# Patient Record
Sex: Male | Born: 1980 | Race: Black or African American | Hispanic: No | Marital: Single | Smoking: Current every day smoker
Health system: Southern US, Community
[De-identification: ages and names within clinical notes are randomized; demographics above are authoritative.]

## PROBLEM LIST (undated history)

## (undated) DIAGNOSIS — K219 Gastro-esophageal reflux disease without esophagitis: Secondary | ICD-10-CM

## (undated) DIAGNOSIS — I10 Essential (primary) hypertension: Secondary | ICD-10-CM

## (undated) DIAGNOSIS — K5792 Diverticulitis of intestine, part unspecified, without perforation or abscess without bleeding: Secondary | ICD-10-CM

---

## 2012-10-03 ENCOUNTER — Encounter (HOSPITAL_COMMUNITY): Payer: Self-pay | Admitting: Cardiology

## 2012-10-03 ENCOUNTER — Emergency Department (HOSPITAL_COMMUNITY)
Admission: EM | Admit: 2012-10-03 | Discharge: 2012-10-03 | Discharge status: Against Medical Advice | Payer: Self-pay | Attending: Emergency Medicine | Admitting: Emergency Medicine

## 2012-10-03 DIAGNOSIS — IMO0002 Reserved for concepts with insufficient information to code with codable children: Secondary | ICD-10-CM | POA: Insufficient documentation

## 2012-10-03 DIAGNOSIS — Y9389 Activity, other specified: Secondary | ICD-10-CM | POA: Insufficient documentation

## 2012-10-03 DIAGNOSIS — Y92410 Unspecified street and highway as the place of occurrence of the external cause: Secondary | ICD-10-CM | POA: Insufficient documentation
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
## 2012-10-03 NOTE — ED Notes (Signed)
Called pt 2 times and no answer

## 2016-03-15 ENCOUNTER — Other Ambulatory Visit: Payer: Self-pay

## 2016-03-15 ENCOUNTER — Emergency Department (HOSPITAL_BASED_OUTPATIENT_CLINIC_OR_DEPARTMENT_OTHER)
Admission: EM | Admit: 2016-03-15 | Discharge: 2016-03-16 | Disposition: A | Discharge status: Home / Self Care | Payer: Self-pay | Attending: Emergency Medicine | Admitting: Emergency Medicine

## 2016-03-15 DIAGNOSIS — R0789 Other chest pain: Secondary | ICD-10-CM | POA: Insufficient documentation

## 2016-03-15 NOTE — ED Notes (Signed)
Patient states that he is having pain to his left chest. Reports that it started about 2 hours ago. Vomited x 1

## 2016-03-16 ENCOUNTER — Encounter (HOSPITAL_BASED_OUTPATIENT_CLINIC_OR_DEPARTMENT_OTHER): Payer: Self-pay | Admitting: Emergency Medicine

## 2016-03-16 ENCOUNTER — Emergency Department (HOSPITAL_BASED_OUTPATIENT_CLINIC_OR_DEPARTMENT_OTHER): Payer: Self-pay

## 2016-03-16 MED ORDER — IBUPROFEN 800 MG PO TABS: 800.0000 mg | ORAL_TABLET | Freq: Three times a day (TID) | ORAL | Status: DC | PRN

## 2016-03-16 MED ORDER — KETOROLAC TROMETHAMINE 30 MG/ML IJ SOLN
60.0000 mg | Freq: Once | INTRAMUSCULAR | Status: AC
  Administered 2016-03-16: 60 mg via INTRAMUSCULAR
  Filled 2016-03-16: qty 2

## 2016-03-16 NOTE — ED Notes (Signed)
Pt given juice

## 2016-03-16 NOTE — Discharge Instructions (Signed)
Chest Wall Pain °Chest wall pain is pain in or around the bones and muscles of your chest. Sometimes, an injury causes this pain. Sometimes, the cause may not be known. This pain may take several weeks or longer to get better. °HOME CARE INSTRUCTIONS  °Pay attention to any changes in your symptoms. Take these actions to help with your pain:  °· Rest as told by your health care provider.   °· Avoid activities that cause pain. These include any activities that use your chest muscles or your abdominal and side muscles to lift heavy items.    °· If directed, apply ice to the painful area: °¨ Put ice in a plastic bag. °¨ Place a towel between your skin and the bag. °¨ Leave the ice on for 20 minutes, 2-3 times per day. °· Take over-the-counter and prescription medicines only as told by your health care provider. °· Do not use tobacco products, including cigarettes, chewing tobacco, and e-cigarettes. If you need help quitting, ask your health care provider. °· Keep all follow-up visits as told by your health care provider. This is important. °SEEK MEDICAL CARE IF: °· You have a fever. °· Your chest pain becomes worse. °· You have new symptoms. °SEEK IMMEDIATE MEDICAL CARE IF: °· You have nausea or vomiting. °· You feel sweaty or light-headed. °· You have a cough with phlegm (sputum) or you cough up blood. °· You develop shortness of breath. °  °This information is not intended to replace advice given to you by your health care provider. Make sure you discuss any questions you have with your health care provider. °  °Document Released: 09/14/2005 Document Revised: 06/05/2015 Document Reviewed: 12/10/2014 °Elsevier Interactive Patient Education ©2016 [employer]vier Inc. ° ° °To find a primary care or specialty doctor please call [phone] or [phone] to access "[facility] Find a Doctor Service." ° °You may also go on the [facility] website at [email or web address] ° °There are also multiple [facility]  and [facility] practices throughout the [place] that are frequently accepting new patients. You may find a clinic that is close to your home and contact them. ° °[facility] -  °[address] °[place] [address] °[phone] ° °[facility] in [place] (also locations in [place] and [place]) -  °[address] °[place] [address] °[phone] ° °[facility] -  °[address] °[place] [address] °[phone] ° ° ° °

## 2016-03-16 NOTE — ED Notes (Signed)
Patient transported to X-ray 

## 2016-03-16 NOTE — ED Provider Notes (Signed)
By signing my name below, I, Marisue Humble, attest that this documentation has been prepared under the direction and in the presence of Kaeley Vinje N Carolin Quang, DO . Electronically Signed: Marisue Humble, Scribe. 03/16/2016. 12:36 AM.  TIME SEEN: 12:22 AM  CHIEF COMPLAINT: Chest Pain  HPI: HPI Comments:  Spencer Garza is a 35 y.o. male with no pertinent PMHx who presents to the Emergency Department complaining of sharp left side chest pain onset ~2 hours ago while resting, worse with twisting and moving left arm. Pt reports associated episode of vomiting and tingling in left arm and shoulder. Nausea is currently alleviated. No other alleviating or exacerbating factors noted. He notes similar symptoms ~6 months ago; he was evaluated at [facility], and reports having a CT scan with no acute diagnosis. Pt is a current smoker. He notes his uncle had an MI at age 13, grandmother at 67. No history of PE, DVT, exogenous estrogen use, fracture, surgery, trauma, hospitalization, prolonged travel. No lower extremity swelling or pain. No calf tenderness.  He denies that pain is exertional, pleuritic. No pressure, diaphoresis, dizziness, shortness of breath. Denies fever or cough.  ROS: See HPI Constitutional: no fever  Eyes: no drainage  ENT: no runny nose   Cardiovascular:  chest pain  Resp: no SOB  GI: vomiting GU: no dysuria Integumentary: no rash  Allergy: no hives  Musculoskeletal: no leg swelling  Neurological: no slurred speech ROS otherwise negative  PAST MEDICAL HISTORY/PAST SURGICAL HISTORY:  History reviewed. No pertinent past medical history.  MEDICATIONS:  Prior to Admission medications   Not on File    ALLERGIES:  No Known Allergies  SOCIAL HISTORY:  Social History  Substance Use Topics  . Smoking status: Never Smoker   . Smokeless tobacco: Not on file  . Alcohol Use: Yes    FAMILY HISTORY: History reviewed. No pertinent family history.  EXAM: BP 115/86 mmHg   Pulse 91  Temp(Src) 98.6 F (37 C) (Oral)  Resp 22  Ht  (1.676 m)  Wt 255 lb (115.667 kg)  BMI 41.18 kg/m2  SpO2 94% CONSTITUTIONAL: Alert and oriented and responds appropriately to questions. Well-appearing; well-nourished HEAD: Normocephalic EYES: Conjunctivae clear, PERRL ENT: normal nose; no rhinorrhea; moist mucous membranes NECK: Supple, no meningismus, no LAD  CARD: RRR; S1 and S2 appreciated; no murmurs, no clicks, no rubs, no gallops CHEST: pt says non tender with palpation of chest wall but does grimace when I palpate the left chest wall; no crepitus, deformity; pt has pain reproducible with movement of left arm RESP: Normal chest excursion without splinting or tachypnea; breath sounds clear and equal bilaterally; no wheezes, no rhonchi, no rales, no hypoxia or respiratory distress, speaking full sentences ABD/GI: Normal bowel sounds; non-distended; soft, non-tender, no rebound, no guarding, no peritoneal signs BACK:  The back appears normal and is non-tender to palpation, there is no CVA tenderness EXT: Normal ROM in all joints; non-tender to palpation; no edema; normal capillary refill; no cyanosis, no calf tenderness or swelling; no bony tenderness over left shoulder; 2+ left radial pulse; soft compartments SKIN: Normal color for age and race; warm; no rash NEURO: Moves all extremities equally, sensation to light touch intact diffusely, cranial nerves II through XII intact PSYCH: The patient's mood and manner are appropriate. Grooming and personal hygiene are appropriate.  MEDICAL DECISION MAKING: Patient here with what seems to be musculoskeletal chest pain. Pain is sharp in nature and worse with movement of the left arm. EKG shows no  ischemic abnormality. We'll obtain chest x-ray, give Toradol. Doubt ACS, dissection or PE. No risk factors for pulmonary embolus.  I have reviewed patient's outside hospital records. It does appear that [facility] 06/[facility]/2015 for similar symptoms. Had a negative CTA of his chest and 2 negative troponins.  ED PROGRESS: 1:55 AM  Pt's pain is much better. Chest x-ray is clear.  Awaiting official radiology read but anticipate discharge home. We'll discharge with anti-inflammatories. Discussed return precautions. He verbalizes understanding and is comfortable with this plan.    At this time, I do not feel there is any life-threatening condition present. I have reviewed and discussed all results (EKG, imaging, lab, urine as appropriate), exam findings with patient. I have reviewed nursing notes and appropriate previous records.  I feel the patient is safe to be discharged home without further emergent workup. Discussed usual and customary return precautions. Patient and family (if present) verbalize understanding and are comfortable with this plan.  Patient will follow-up with their primary care provider. If they do not have a primary care provider, information for follow-up has been provided to them. All questions have been answered.    EKG Interpretation  Date/Time:  Sunday March 15 2016 23:58:57 EDT Ventricular Rate:  97 PR Interval:  136 QRS Duration: 76 QT Interval:  326 QTC Calculation: 414 R Axis:   47 Text Interpretation:  Normal sinus rhythm Possible Left atrial enlargement Cannot rule out Anterior infarct , age undetermined Abnormal ECG No old tracing to compare Confirmed by Keeyon Privitera,  DO, Tadd Holtmeyer ([id number]) on 03/16/2016 12:02:27 AM      I personally performed the services described in this documentation, which was scribed in my presence. The recorded information has been reviewed and is accurate.    Layla MawKristen N Mykaylah Ballman, DO 03/16/16 [phone]

## 2016-03-16 NOTE — ED Notes (Signed)
MD at bedside. 

## 2016-08-16 ENCOUNTER — Encounter (HOSPITAL_BASED_OUTPATIENT_CLINIC_OR_DEPARTMENT_OTHER): Payer: Self-pay | Admitting: Emergency Medicine

## 2016-08-16 ENCOUNTER — Emergency Department (HOSPITAL_BASED_OUTPATIENT_CLINIC_OR_DEPARTMENT_OTHER): Payer: Self-pay

## 2016-08-16 ENCOUNTER — Emergency Department (HOSPITAL_BASED_OUTPATIENT_CLINIC_OR_DEPARTMENT_OTHER)
Admission: EM | Admit: 2016-08-16 | Discharge: 2016-08-16 | Disposition: A | Discharge status: Home / Self Care | Payer: Self-pay | Attending: Emergency Medicine | Admitting: Emergency Medicine

## 2016-08-16 DIAGNOSIS — F17200 Nicotine dependence, unspecified, uncomplicated: Secondary | ICD-10-CM | POA: Insufficient documentation

## 2016-08-16 DIAGNOSIS — B349 Viral infection, unspecified: Secondary | ICD-10-CM | POA: Insufficient documentation

## 2016-08-16 MED ORDER — ACETAMINOPHEN 325 MG PO TABS
650.0000 mg | ORAL_TABLET | Freq: Once | ORAL | Status: AC
  Administered 2016-08-16: 650 mg via ORAL
  Filled 2016-08-16: qty 2

## 2016-08-16 MED ORDER — PROMETHAZINE-DM 6.25-15 MG/5ML PO SYRP: 5.0000 mL | ORAL_SOLUTION | Freq: Four times a day (QID) | ORAL | 0 refills | Status: DC | PRN

## 2016-08-16 MED ORDER — GUAIFENESIN 100 MG/5ML PO LIQD: 100.0000 mg | ORAL | 0 refills | Status: DC | PRN

## 2016-08-16 NOTE — ED Notes (Signed)
Patient denies pain and is resting comfortably.  

## 2016-08-16 NOTE — ED Triage Notes (Signed)
Patient reports feeling bad x 3 days.  Reports body aches, chest congestion, intermittent headache with sinus pressure.

## 2016-08-16 NOTE — ED Provider Notes (Signed)
[facility] Provider Note   CSN: [id number] Arrival date & time: 08/16/16  1706  By signing my name below, I, Rosario AdieWilliam Andrew Hiatt, attest that this documentation has been prepared under the direction and in the presence of Fayrene HelperBowie Azjah Pardo, PA-C.  Electronically Signed: Rosario AdieWilliam Andrew Hiatt, ED Scribe. 08/16/16. 5:34 PM.  History   Chief Complaint Chief Complaint  Patient presents with  . Generalized Body Aches   The history is provided by the patient. No language interpreter was used.    HPI Comments: Jerelyn CharlesColby Vespa is a 35 y.o. male with no pertinent PMHx, who presents to the Emergency Department complaining of persistent, diffuse myalgias/arthralgias onset approximately 3 days ago. He reports associated sore throat, chest congestion, rhinorrhea, sneezing, dry cough, subjective fever, and fatigue secondary to his current symptoms. He has been taking Tylenol, Alka Seltzer, and using Mucinex with minimal relief of his symptoms. No sick contacts with similar symptoms. Pt did no receive his Influenza or PNA vaccination this year. He denies SOB, vomiting, nausea, diarrhea, rash, or any other associated symptoms.   History reviewed. No pertinent past medical history.  There are no active problems to display for this patient.  History reviewed. No pertinent surgical history.  Home Medications    Prior to Admission medications   Medication Sig Start Date End Date Taking? Authorizing Provider  ibuprofen (ADVIL,MOTRIN) 800 MG tablet Take 1 tablet (800 mg total) by mouth every 8 (eight) hours as needed for mild pain. 03/16/16   Layla MawKristen N Ward, DO   Family History History reviewed. No pertinent family history.  Social History Social History  Substance Use Topics  . Smoking status: Current Every Day Smoker    Packs/day: 0.50  . Smokeless tobacco: Never Used  . Alcohol use Yes   Allergies   Patient has no known allergies.  Review of Systems Review of Systems  Constitutional:  Positive for fever (subjective).  HENT: Positive for congestion, rhinorrhea, sneezing and sore throat.   Respiratory: Positive for cough. Negative for shortness of breath.   Gastrointestinal: Negative for diarrhea, nausea and vomiting.  Musculoskeletal: Positive for arthralgias (diffuse) and myalgias (diffuse).  Skin: Negative for rash.   Physical Exam Updated Vital Signs BP 151/94 (BP Location: Right Arm)   Pulse 102   Temp 100.1 F (37.8 C) (Oral)   Resp 19   Ht 5\' 5"  (1.651 m)   Wt 257 lb (116.6 kg)   SpO2 98%   BMI 42.77 kg/m   Physical Exam  Constitutional: He appears well-developed and well-nourished. No distress.  HENT:  Head: Normocephalic and atraumatic.  Right Ear: External ear normal.  Left Ear: External ear normal.  Mouth/Throat: Uvula is midline, oropharynx is clear and moist and mucous membranes are normal. No trismus in the jaw. No uvula swelling. No oropharyngeal exudate, posterior oropharyngeal edema, posterior oropharyngeal erythema or tonsillar abscesses. No tonsillar exudate.  Right ear with cerumen impaction but left is otherwise normal. Mild congestion in the nose.   Eyes: Conjunctivae are normal.  Neck: Normal range of motion.  Cardiovascular: Regular rhythm and normal heart sounds.  Tachycardia present.   No murmur heard. Mild tachycardia.  Pulmonary/Chest: Effort normal and breath sounds normal. No respiratory distress. He has no wheezes. He has no rales.  Abdominal: He exhibits no distension.  Musculoskeletal: Normal range of motion.  Neurological: He is alert.  Skin: No pallor.  Psychiatric: He has a normal mood and affect. His behavior is normal.  Nursing note and vitals reviewed.  ED Treatments / Results  DIAGNOSTIC STUDIES: Oxygen Saturation is 98% on RA, normal by my interpretation.   COORDINATION OF CARE: 5:32 PM-Discussed next steps with pt. Pt verbalized understanding and is agreeable with the plan.   Labs (all labs ordered are  listed, but only abnormal results are displayed) Labs Reviewed - No data to display  EKG  EKG Interpretation None      Radiology No results found.  Procedures Procedures   Medications Ordered in ED Medications - No data to display  Initial Impression / Assessment and Plan / ED Course  I have reviewed the triage vital signs and the nursing notes.  Pertinent labs & imaging results that were available during my care of the patient were reviewed by me and considered in my medical decision making (see chart for details).  Clinical Course    Pt CXR negative for acute infiltrate. Patients symptoms are consistent with URI, likely viral etiology. Discussed that antibiotics are not indicated for viral infections. Pt will be discharged with symptomatic treatment.  Verbalizes understanding and is agreeable with plan. Pt is hemodynamically stable & in NAD prior to dc.  Final Clinical Impressions(s) / ED Diagnoses   Final diagnoses:  Viral syndrome   New Prescriptions New Prescriptions   GUAIFENESIN (ROBITUSSIN) 100 MG/5ML LIQUID    Take 5-10 mLs (100-200 mg total) by mouth every 4 (four) hours as needed for congestion.   PROMETHAZINE-DEXTROMETHORPHAN (PROMETHAZINE-DM) 6.25-15 MG/5ML SYRUP    Take 5 mLs by mouth 4 (four) times daily as needed for cough.   I personally performed the services described in this documentation, which was scribed in my presence. The recorded information has been reviewed and is accurate.       Fayrene HelperBowie Kemond Amorin, PA-C 08/16/16 1811    Alvira MondayErin Schlossman, MD 08/17/16 2121

## 2016-08-16 NOTE — ED Notes (Signed)
States has had nasal congestion and cough for the past few days

## 2017-03-12 ENCOUNTER — Emergency Department (HOSPITAL_BASED_OUTPATIENT_CLINIC_OR_DEPARTMENT_OTHER)
Admission: EM | Admit: 2017-03-12 | Discharge: 2017-03-12 | Disposition: A | Discharge status: Home / Self Care | Payer: Self-pay | Attending: Emergency Medicine | Admitting: Emergency Medicine

## 2017-03-12 ENCOUNTER — Emergency Department (HOSPITAL_BASED_OUTPATIENT_CLINIC_OR_DEPARTMENT_OTHER): Payer: Self-pay

## 2017-03-12 ENCOUNTER — Encounter (HOSPITAL_BASED_OUTPATIENT_CLINIC_OR_DEPARTMENT_OTHER): Payer: Self-pay | Admitting: *Deleted

## 2017-03-12 DIAGNOSIS — F17200 Nicotine dependence, unspecified, uncomplicated: Secondary | ICD-10-CM | POA: Insufficient documentation

## 2017-03-12 DIAGNOSIS — K219 Gastro-esophageal reflux disease without esophagitis: Secondary | ICD-10-CM | POA: Insufficient documentation

## 2017-03-12 LAB — CBC WITH DIFFERENTIAL/PLATELET
BASOS ABS: 0 10*3/uL (ref 0.0–0.1)
Basophils Relative: 1 %
EOS PCT: 1 %
Eosinophils Absolute: 0.1 10*3/uL (ref 0.0–0.7)
HCT: 47.1 % (ref 39.0–52.0)
Hemoglobin: 16.7 g/dL (ref 13.0–17.0)
LYMPHS ABS: 1.7 10*3/uL (ref 0.7–4.0)
LYMPHS PCT: 29 %
MCH: 31.6 pg (ref 26.0–34.0)
MCHC: 35.5 g/dL (ref 30.0–36.0)
MCV: 89.2 fL (ref 78.0–100.0)
MONO ABS: 0.9 10*3/uL (ref 0.1–1.0)
Monocytes Relative: 15 %
NEUTROS ABS: 3.2 10*3/uL (ref 1.7–7.7)
Neutrophils Relative %: 54 %
Platelets: 222 10*3/uL (ref 150–400)
RBC: 5.28 MIL/uL (ref 4.22–5.81)
RDW: 13.3 % (ref 11.5–15.5)
WBC: 5.9 10*3/uL (ref 4.0–10.5)

## 2017-03-12 LAB — COMPREHENSIVE METABOLIC PANEL
ALT: 32 U/L (ref 17–63)
AST: 33 U/L (ref 15–41)
Albumin: 4.2 g/dL (ref 3.5–5.0)
Alkaline Phosphatase: 50 U/L (ref 38–126)
Anion gap: 7 (ref 5–15)
BUN: 13 mg/dL (ref 6–20)
CO2: 28 mmol/L (ref 22–32)
Calcium: 9.2 mg/dL (ref 8.9–10.3)
Chloride: 103 mmol/L (ref 101–111)
Creatinine, Ser: 1.19 mg/dL (ref 0.61–1.24)
Glucose, Bld: 98 mg/dL (ref 65–99)
Potassium: 4.2 mmol/L (ref 3.5–5.1)
Sodium: 138 mmol/L (ref 135–145)
TOTAL PROTEIN: 7.3 g/dL (ref 6.5–8.1)
Total Bilirubin: 1.5 mg/dL — ABNORMAL HIGH (ref 0.3–1.2)

## 2017-03-12 MED ORDER — CIPROFLOXACIN HCL 500 MG PO TABS
500.0000 mg | ORAL_TABLET | Freq: Two times a day (BID) | ORAL | 0 refills | Status: DC
Start: 2017-03-12 — End: 2020-01-16

## 2017-03-12 MED ORDER — IOPAMIDOL (ISOVUE-300) INJECTION 61%
100.0000 mL | Freq: Once | INTRAVENOUS | Status: AC | PRN
  Administered 2017-03-12: 100 mL via INTRAVENOUS

## 2017-03-12 MED ORDER — METRONIDAZOLE 500 MG PO TABS: 500.0000 mg | ORAL_TABLET | Freq: Two times a day (BID) | ORAL | 0 refills | Status: DC

## 2017-03-12 MED ORDER — CIPROFLOXACIN HCL 500 MG PO TABS
500.0000 mg | ORAL_TABLET | Freq: Once | ORAL | Status: AC
  Administered 2017-03-12: 500 mg via ORAL
  Filled 2017-03-12: qty 1

## 2017-03-12 MED ORDER — OMEPRAZOLE 20 MG PO CPDR: 20.0000 mg | DELAYED_RELEASE_CAPSULE | Freq: Every day | ORAL | 0 refills | Status: DC

## 2017-03-12 MED ORDER — METRONIDAZOLE 500 MG PO TABS
500.0000 mg | ORAL_TABLET | Freq: Once | ORAL | Status: AC
  Administered 2017-03-12: 500 mg via ORAL
  Filled 2017-03-12: qty 1

## 2017-03-12 MED FILL — OMEPRAZOLE DR 20 MG CAPSULE: 20 | 30 days supply | Qty: 30 | Fill #0

## 2017-03-12 MED FILL — CIPROFLOXACIN HCL 500 MG TA: 500 | 14 days supply | Qty: 28 | Fill #0

## 2017-03-12 MED FILL — metroNIDAZOLE 500 MG TABS: 500 | 7 days supply | Qty: 14 | Fill #0

## 2017-03-12 NOTE — Discharge Instructions (Signed)
You have diverticulitis. Please take antibiotics as prescribed. I also think you have symptoms of GERD. Please take omeprazole to help manage these. Please follow-up with a GI physician as an outpatient.

## 2017-03-12 NOTE — ED Triage Notes (Signed)
Abdominal pain on and off since January. Mucus in his stools. He was diagnosed with diverticulitis.

## 2017-03-12 NOTE — ED Notes (Signed)
Pt ambulatory unassisted, in NAD. 

## 2017-03-12 NOTE — ED Provider Notes (Signed)
[facility]   CSN: [id number] Arrival date & time: 03/12/17  1158     History   Chief Complaint Chief Complaint  Patient presents with  . Abdominal Pain    HPI Trayce Maino is a 36 y.o. male.  HPI   Patient is a 36 year old male presenting with abdominal pain. Patient reports intermittent pain ongoing for the last 12 months. However to worsen last 3 days. In particular left lower quadrant pain. He reports intermittent trouble with his bowels. He takes MiraLAX daily. He also reports that every time he lays down he has increasing sensation of burning in the back of his throat and a poor taste.  Has been told he has diverticulitis in the past, patient never followed up.  History reviewed. No pertinent past medical history.  There are no active problems to display for this patient.   History reviewed. No pertinent surgical history.     Home Medications    Prior to Admission medications   Medication Sig Start Date End Date Taking? Authorizing Provider  ciprofloxacin (CIPRO) 500 MG tablet Take 1 tablet (500 mg total) by mouth 2 (two) times daily. 03/12/17   Sylvio Weatherall Lyn, MD  guaiFENesin (ROBITUSSIN) 100 MG/5ML liquid Take 5-10 mLs (100-200 mg total) by mouth every 4 (four) hours as needed for congestion. 08/16/16   Fayrene Helper, PA-C  ibuprofen (ADVIL,MOTRIN) 800 MG tablet Take 1 tablet (800 mg total) by mouth every 8 (eight) hours as needed for mild pain. 03/16/16   Ward, Layla Maw, DO  metroNIDAZOLE (FLAGYL) 500 MG tablet Take 1 tablet (500 mg total) by mouth 2 (two) times daily. 03/12/17   Jimmie Rueter Lyn, MD  omeprazole (PRILOSEC) 20 MG capsule Take 1 capsule (20 mg total) by mouth daily. 03/12/17   Kassem Kibbe Lyn, MD  promethazine-dextromethorphan (PROMETHAZINE-DM) 6.25-15 MG/5ML syrup Take 5 mLs by mouth 4 (four) times daily as needed for cough. 08/16/16   Fayrene Helper, PA-C    Family History No family history on  file.  Social History Social History  Substance Use Topics  . Smoking status: Current Every Day Smoker    Packs/day: 0.50  . Smokeless tobacco: Never Used  . Alcohol use Yes     Allergies   Patient has no known allergies.   Review of Systems Review of Systems  Constitutional: Negative for fatigue and fever.  Gastrointestinal: Positive for abdominal pain, constipation and nausea. Negative for vomiting.  All other systems reviewed and are negative.    Physical Exam Updated Vital Signs BP 105/89 (BP Location: Right Arm)   Pulse 63   Temp 98.1 F (36.7 C) (Oral)   Resp 16   Ht 5\' 6"  (1.676 m)   Wt 113.4 kg (250 lb)   SpO2 100%   BMI 40.35 kg/m   Physical Exam  Constitutional: He is oriented to person, place, and time. He appears well-nourished.  HENT:  Head: Normocephalic.  Eyes: Conjunctivae are normal.  Cardiovascular: Normal rate.   Pulmonary/Chest: Effort normal and breath sounds normal. No respiratory distress.  Abdominal: There is tenderness.  LLQ pain  Neurological: He is oriented to person, place, and time.  Skin: Skin is warm and dry. He is not diaphoretic.  Psychiatric: He has a normal mood and affect. His behavior is normal.     ED Treatments / Results  Labs (all labs ordered are listed, but only abnormal results are displayed) Labs Reviewed  COMPREHENSIVE METABOLIC PANEL - Abnormal; Notable for the  following:       Result Value   Total Bilirubin 1.5 (*)    All other components within normal limits  CBC WITH DIFFERENTIAL/PLATELET    EKG  EKG Interpretation None       Radiology Ct Abdomen Pelvis W Contrast  Result Date: 03/12/2017 CLINICAL DATA:  Initial evaluation for acute left lower quadrant abdominal pain. EXAM: CT ABDOMEN AND PELVIS WITH CONTRAST TECHNIQUE: Multidetector CT imaging of the abdomen and pelvis was performed using the standard protocol following bolus administration of intravenous contrast. CONTRAST:  100mL ISOVUE-300  IOPAMIDOL (ISOVUE-300) INJECTION 61% COMPARISON:  None available. FINDINGS: Lower chest: Mild atelectatic changes present dependently within the visualized lung bases. Visualized lung bases are otherwise clear. Hepatobiliary: Liver demonstrates a normal contrast enhanced appearance. Gallbladder within normal limits. No biliary dilatation. Pancreas: Pancreas within normal limits. Spleen: Spleen within normal limits. Adrenals/Urinary Tract: Adrenal glands are normal. Kidneys equal in size with symmetric enhancement. No nephrolithiasis, hydronephrosis, or focal enhancing renal mass. No hydroureter. Bladder partially distended without acute abnormality. Stomach/Bowel: Stomach within normal limits. No evidence for bowel obstruction. Appendix normal. There is focus of acute inflammatory changes advance a ease sigmoid diverticulum at the lower mid abdomen (series 2, image 67), consistent with acute diverticulitis. No evidence for perforation or other complication. No other acute inflammatory changes about the bowels. Vascular/Lymphatic: Normal intravascular enhancement seen throughout the intra-abdominal aorta and its branch vessels. No adenopathy. Reproductive: Prostate normal. Other: No free air or fluid. Musculoskeletal: No acute osseus abnormality. No worrisome lytic or blastic osseous lesions. IMPRESSION: 1. Findings consistent with acute sigmoid diverticulitis. No complication identified. 2. No other acute intra-abdominal or pelvic process. Electronically Signed   By: Rise MuBenjamin  McClintock M.D.   On: 03/12/2017 15:53    Procedures Procedures (including critical care time)  Medications Ordered in ED Medications  iopamidol (ISOVUE-300) 61 % injection 100 mL (100 mLs Intravenous Contrast Given 03/12/17 1533)  ciprofloxacin (CIPRO) tablet 500 mg (500 mg Oral Given 03/12/17 1614)  metroNIDAZOLE (FLAGYL) tablet 500 mg (500 mg Oral Given 03/12/17 1614)     Initial Impression / Assessment and Plan / ED Course  I  have reviewed the triage vital signs and the nursing notes.  Pertinent labs & imaging results that were available during my care of the patient were reviewed by me and considered in my medical decision making (see chart for details).     Patient is a 36 year-old [place] male presenting with left lower quadrant pain for last 3 days. Concern for diverticulitis. We'll get scan. Patient also having symptoms of GERD. We'll treat accordingly.   4:14 PM CT shows diverticulitis.  Will treat with abx, follow up with PCP.  Patient is comfortable, ambulatory, and taking PO at time of discharge.  Patient expressed understanding about return precautions.    Final Clinical Impressions(s) / ED Diagnoses   Final diagnoses:  Gastroesophageal reflux disease without esophagitis    New Prescriptions New Prescriptions   CIPROFLOXACIN (CIPRO) 500 MG TABLET    Take 1 tablet (500 mg total) by mouth 2 (two) times daily.   METRONIDAZOLE (FLAGYL) 500 MG TABLET    Take 1 tablet (500 mg total) by mouth 2 (two) times daily.   OMEPRAZOLE (PRILOSEC) 20 MG CAPSULE    Take 1 capsule (20 mg total) by mouth daily.     Abelino DerrickMackuen, Elfrida Pixley Lyn, MD 03/12/17 1614

## 2017-12-20 ENCOUNTER — Other Ambulatory Visit: Payer: Self-pay

## 2017-12-20 ENCOUNTER — Emergency Department (HOSPITAL_BASED_OUTPATIENT_CLINIC_OR_DEPARTMENT_OTHER): Payer: BLUE CROSS/BLUE SHIELD

## 2017-12-20 ENCOUNTER — Encounter (HOSPITAL_BASED_OUTPATIENT_CLINIC_OR_DEPARTMENT_OTHER): Payer: Self-pay | Admitting: Emergency Medicine

## 2017-12-20 ENCOUNTER — Emergency Department (HOSPITAL_BASED_OUTPATIENT_CLINIC_OR_DEPARTMENT_OTHER)
Admission: EM | Admit: 2017-12-20 | Discharge: 2017-12-20 | Disposition: A | Discharge status: Home / Self Care | Payer: BLUE CROSS/BLUE SHIELD | Attending: Emergency Medicine | Admitting: Emergency Medicine

## 2017-12-20 DIAGNOSIS — K5732 Diverticulitis of large intestine without perforation or abscess without bleeding: Secondary | ICD-10-CM

## 2017-12-20 DIAGNOSIS — R9431 Abnormal electrocardiogram [ECG] [EKG]: Secondary | ICD-10-CM

## 2017-12-20 DIAGNOSIS — Z79899 Other long term (current) drug therapy: Secondary | ICD-10-CM | POA: Insufficient documentation

## 2017-12-20 DIAGNOSIS — F1410 Cocaine abuse, uncomplicated: Secondary | ICD-10-CM

## 2017-12-20 DIAGNOSIS — F17210 Nicotine dependence, cigarettes, uncomplicated: Secondary | ICD-10-CM | POA: Insufficient documentation

## 2017-12-20 DIAGNOSIS — F1210 Cannabis abuse, uncomplicated: Secondary | ICD-10-CM

## 2017-12-20 HISTORY — DX: Diverticulitis of intestine, part unspecified, without perforation or abscess without bleeding: K57.92

## 2017-12-20 LAB — BASIC METABOLIC PANEL
Anion gap: 9 (ref 5–15)
BUN: 18 mg/dL (ref 6–20)
CO2: 24 mmol/L (ref 22–32)
Calcium: 8.8 mg/dL — ABNORMAL LOW (ref 8.9–10.3)
Chloride: 102 mmol/L (ref 101–111)
Creatinine, Ser: 1.03 mg/dL (ref 0.61–1.24)
GFR calc Af Amer: >60 mL/min (ref >60)
GLUCOSE: 129 mg/dL — AB (ref 65–99)
POTASSIUM: 4 mmol/L (ref 3.5–5.1)
Sodium: 135 mmol/L (ref 135–145)

## 2017-12-20 LAB — CBC WITH DIFFERENTIAL/PLATELET
BASOS PCT: 0 %
Basophils Absolute: 0 10*3/uL (ref 0.0–0.1)
Eosinophils Absolute: 0 10*3/uL (ref 0.0–0.7)
Eosinophils Relative: 0 %
HEMATOCRIT: 49 % (ref 39.0–52.0)
Hemoglobin: 17.5 g/dL — ABNORMAL HIGH (ref 13.0–17.0)
Lymphocytes Relative: 7 %
Lymphs Abs: 0.7 10*3/uL (ref 0.7–4.0)
MCH: 31.9 pg (ref 26.0–34.0)
MCHC: 35.7 g/dL (ref 30.0–36.0)
MCV: 89.3 fL (ref 78.0–100.0)
MONO ABS: 0.5 10*3/uL (ref 0.1–1.0)
Monocytes Relative: 5 %
NEUTROS ABS: 9.1 10*3/uL — AB (ref 1.7–7.7)
NEUTROS PCT: 88 %
Platelets: 210 10*3/uL (ref 150–400)
RBC: 5.49 MIL/uL (ref 4.22–5.81)
RDW: 12.9 % (ref 11.5–15.5)
WBC: 10.3 10*3/uL (ref 4.0–10.5)

## 2017-12-20 LAB — RAPID URINE DRUG SCREEN, HOSP PERFORMED
Amphetamines: NOT DETECTED
BARBITURATES: NOT DETECTED
Benzodiazepines: NOT DETECTED
COCAINE: POSITIVE — AB
OPIATES: NOT DETECTED
Tetrahydrocannabinol: POSITIVE — AB

## 2017-12-20 LAB — TROPONIN I: Troponin I: <0.03 ng/mL (ref <0.03)

## 2017-12-20 MED ORDER — AMOXICILLIN-POT CLAVULANATE 875-125 MG PO TABS
1.0000 | ORAL_TABLET | Freq: Once | ORAL | Status: AC
  Administered 2017-12-20: 1 via ORAL
  Filled 2017-12-20: qty 1

## 2017-12-20 MED ORDER — AMOXICILLIN-POT CLAVULANATE 875-125 MG PO TABS: 1.0000 | ORAL_TABLET | Freq: Two times a day (BID) | ORAL | 0 refills | Status: DC

## 2017-12-20 MED ORDER — IOPAMIDOL (ISOVUE-300) INJECTION 61%
100.0000 mL | Freq: Once | INTRAVENOUS | Status: AC | PRN
  Administered 2017-12-20: 100 mL via INTRAVENOUS

## 2017-12-20 MED ORDER — KETOROLAC TROMETHAMINE 30 MG/ML IJ SOLN
15.0000 mg | Freq: Once | INTRAMUSCULAR | Status: AC
  Administered 2017-12-20: 15 mg via INTRAVENOUS
  Filled 2017-12-20: qty 1

## 2017-12-20 MED ORDER — ONDANSETRON HCL 8 MG PO TABS
8.0000 mg | ORAL_TABLET | Freq: Once | ORAL | Status: AC
  Administered 2017-12-20: 8 mg via ORAL
  Filled 2017-12-20: qty 1

## 2017-12-20 MED ORDER — DICLOFENAC SODIUM ER 100 MG PO TB24: 100.0000 mg | ORAL_TABLET | Freq: Every day | ORAL | 0 refills | Status: DC

## 2017-12-20 MED ORDER — HALOPERIDOL LACTATE 5 MG/ML IJ SOLN
2.0000 mg | Freq: Once | INTRAMUSCULAR | Status: AC
Start: 2017-12-20 — End: 2017-12-20
  Administered 2017-12-20: 2 mg via INTRAVENOUS
  Filled 2017-12-20: qty 1

## 2017-12-20 NOTE — ED Provider Notes (Addendum)
[facility] EMERGENCY DEPARTMENT Provider Note   CSN: [id number] Arrival date & time: 12/20/17  7829     History   Chief Complaint Chief Complaint  Patient presents with  . Emesis    HPI Spencer Garza is a 37 y.o. male.  The history is provided by the patient.  Emesis   This is a recurrent problem. The current episode started yesterday. The problem occurs 5 to 10 times per day. The problem has not changed since onset.The emesis has an appearance of stomach contents. There has been no fever. Pertinent negatives include no abdominal pain, no arthralgias, no chills, no diarrhea, no fever, no headaches, no myalgias, no sweats and no URI. Associated symptoms comments: No chest pain or shortness of breath. Risk factors: states he has diverticulitis.    Past Medical History:  Diagnosis Date  . Diverticulitis     There are no active problems to display for this patient.   History reviewed. No pertinent surgical history.      Home Medications    Prior to Admission medications   Medication Sig Start Date End Date Taking? Authorizing Provider  famotidine (PEPCID) 20 MG tablet Take 20 mg by mouth 2 (two) times daily.   Yes [provider]  ciprofloxacin (CIPRO) 500 MG tablet Take 1 tablet (500 mg total) by mouth 2 (two) times daily. 03/12/17   Mackuen, Courteney Lyn, MD  guaiFENesin (ROBITUSSIN) 100 MG/5ML liquid Take 5-10 mLs (100-200 mg total) by mouth every 4 (four) hours as needed for congestion. 08/16/16   Fayrene Helper, PA-C  ibuprofen (ADVIL,MOTRIN) 800 MG tablet Take 1 tablet (800 mg total) by mouth every 8 (eight) hours as needed for mild pain. 03/16/16   Ward, Layla Maw, DO  metroNIDAZOLE (FLAGYL) 500 MG tablet Take 1 tablet (500 mg total) by mouth 2 (two) times daily. 03/12/17   Mackuen, Courteney Lyn, MD  omeprazole (PRILOSEC) 20 MG capsule Take 1 capsule (20 mg total) by mouth daily. 03/12/17   Mackuen, Courteney Lyn, MD  promethazine-dextromethorphan  (PROMETHAZINE-DM) 6.25-15 MG/5ML syrup Take 5 mLs by mouth 4 (four) times daily as needed for cough. 08/16/16   Fayrene Helper, PA-C    Family History No family history on file.  Social History Social History   Tobacco Use  . Smoking status: Current Every Day Smoker    Packs/day: 0.50  . Smokeless tobacco: Never Used  Substance Use Topics  . Alcohol use: Yes    Alcohol/week: 0.6 - 1.2 oz    Types: 1 - 2 Shots of liquor per week    Comment: drinks liquor daily  . Drug use: Yes    Types: Marijuana    Comment: last smoked yesterday     Allergies   Patient has no known allergies.   Review of Systems Review of Systems  Constitutional: Negative for chills and fever.  Respiratory: Negative for shortness of breath.   Cardiovascular: Negative for chest pain, palpitations and leg swelling.  Gastrointestinal: Positive for nausea and vomiting. Negative for abdominal distention, abdominal pain, anal bleeding and diarrhea.  Genitourinary: Negative for flank pain.  Musculoskeletal: Negative for arthralgias and myalgias.  Neurological: Negative for headaches.  All other systems reviewed and are negative.    Physical Exam Updated Vital Signs BP (!) 182/118 (BP Location: Right Arm)   Pulse 65   Temp 98.5 F (36.9 C) (Oral)   Resp 14   Ht 5\' 5"  (1.651 m)   Wt 106.6 kg (235 lb)   SpO2 99%  BMI 39.11 kg/m   Physical Exam  Constitutional: He is oriented to person, place, and time. He appears well-developed and well-nourished. No distress.  HENT:  Head: Normocephalic and atraumatic.  Mouth/Throat: No oropharyngeal exudate.  Eyes: Pupils are equal, round, and reactive to light. Conjunctivae are normal.  Neck: Normal range of motion. Neck supple.  Cardiovascular: Normal rate, regular rhythm, normal heart sounds and intact distal pulses.  Pulmonary/Chest: Effort normal and breath sounds normal. No stridor. He has no wheezes. He has no rales.  Abdominal: Soft. Bowel sounds are  normal. He exhibits no mass. There is no tenderness. There is no rebound and no guarding.  Musculoskeletal: Normal range of motion. He exhibits no edema or tenderness.  Neurological: He is alert and oriented to person, place, and time.  Skin: Skin is warm and dry. Capillary refill takes less than 2 seconds.  Psychiatric: He has a normal mood and affect.     ED Treatments / Results  Labs (all labs ordered are listed, but only abnormal results are displayed)  Results for orders placed or performed during the hospital encounter of 12/20/17  CBC with Differential/Platelet  Result Value Ref Range   WBC 10.3 4.0 - 10.5 K/uL   RBC 5.49 4.22 - 5.81 MIL/uL   Hemoglobin 17.5 (H) 13.0 - 17.0 g/dL   HCT 16.1 09.6 - 04.5 %   MCV 89.3 78.0 - 100.0 fL   MCH 31.9 26.0 - 34.0 pg   MCHC 35.7 30.0 - 36.0 g/dL   RDW 40.9 81.1 - 91.4 %   Platelets 210 150 - 400 K/uL   Neutrophils Relative % 88 %   Neutro Abs 9.1 (H) 1.7 - 7.7 K/uL   Lymphocytes Relative 7 %   Lymphs Abs 0.7 0.7 - 4.0 K/uL   Monocytes Relative 5 %   Monocytes Absolute 0.5 0.1 - 1.0 K/uL   Eosinophils Relative 0 %   Eosinophils Absolute 0.0 0.0 - 0.7 K/uL   Basophils Relative 0 %   Basophils Absolute 0.0 0.0 - 0.1 K/uL  Rapid urine drug screen (hospital performed)  Result Value Ref Range   Opiates NONE DETECTED NONE DETECTED   Cocaine POSITIVE (A) NONE DETECTED   Benzodiazepines NONE DETECTED NONE DETECTED   Amphetamines NONE DETECTED NONE DETECTED   Tetrahydrocannabinol POSITIVE (A) NONE DETECTED   Barbiturates NONE DETECTED NONE DETECTED  Basic metabolic panel  Result Value Ref Range   Sodium 135 135 - 145 mmol/L   Potassium 4.0 3.5 - 5.1 mmol/L   Chloride 102 101 - 111 mmol/L   CO2 24 22 - 32 mmol/L   Glucose, Bld 129 (H) 65 - 99 mg/dL   BUN 18 6 - 20 mg/dL   Creatinine, Ser 7.82 0.61 - 1.24 mg/dL   Calcium 8.8 (L) 8.9 - 10.3 mg/dL   GFR calc non Af Amer >60 >60 mL/min   GFR calc Af Amer >60 >60 mL/min   Anion gap 9  5 - 15  Troponin I  Result Value Ref Range   Troponin I <0.03 <0.03 ng/mL   Ct Abdomen Pelvis W Contrast  Result Date: 12/20/2017 CLINICAL DATA:  Patient with vomiting.  History of diverticulitis. EXAM: CT ABDOMEN AND PELVIS WITH CONTRAST TECHNIQUE: Multidetector CT imaging of the abdomen and pelvis was performed using the standard protocol following bolus administration of intravenous contrast. CONTRAST:  ISOVUE-300 IOPAMIDOL (ISOVUE-300) INJECTION 61% COMPARISON:  CT abdomen pelvis 03/12/2017. FINDINGS: Lower chest: Normal heart size. Dependent atelectasis left lower lobe. No  pleural effusion. Hepatobiliary: Liver is normal in size and contour. No focal lesion identified. Gallbladder is unremarkable. No intrahepatic or extrahepatic biliary ductal dilatation. Pancreas: Unremarkable Spleen: Unremarkable Adrenals/Urinary Tract: Normal adrenal glands. Kidneys enhance symmetrically with contrast. No hydronephrosis. Urinary bladder is decompressed. Stomach/Bowel: Normal appendix. Sigmoid colonic diverticulosis. Mild inflammatory stranding about the diverticuli (image 36; series 5) within the sigmoid colon. Normal morphology of the stomach. No evidence for small bowel obstruction. No free fluid or free intraperitoneal air. Vascular/Lymphatic: Normal caliber abdominal aorta. Retroaortic left renal vein. No retroperitoneal lymphadenopathy. Reproductive: Prostate unremarkable. Other: None. Musculoskeletal: No aggressive or acute appearing osseous lesions. IMPRESSION: Findings suggestive of mild acute sigmoid diverticulitis. No evidence for perforation or abscess formation. Electronically Signed   By: Annia Beltrew  Davis M.D.   On: 12/20/2017 10:29    EKG  EKG Interpretation  Date/Time:  Monday December 20 2017 09:21:45 EDT Ventricular Rate:  65 PR Interval:    QRS Duration: 81 QT Interval:  392 QTC Calculation: 408 R Axis:   44 Text Interpretation:  Sinus rhythm Nonspecific T abnormalities, inferior leads  Confirmed by Nicanor AlconPalumbo, Jonda Alanis ([id number]) on 12/20/2017 11:42:17 AM       Radiology Ct Abdomen Pelvis W Contrast  Result Date: 12/20/2017 CLINICAL DATA:  Patient with vomiting.  History of diverticulitis. EXAM: CT ABDOMEN AND PELVIS WITH CONTRAST TECHNIQUE: Multidetector CT imaging of the abdomen and pelvis was performed using the standard protocol following bolus administration of intravenous contrast. CONTRAST:  100mL ISOVUE-300 IOPAMIDOL (ISOVUE-300) INJECTION 61% COMPARISON:  CT abdomen pelvis 03/12/2017. FINDINGS: Lower chest: Normal heart size. Dependent atelectasis left lower lobe. No pleural effusion. Hepatobiliary: Liver is normal in size and contour. No focal lesion identified. Gallbladder is unremarkable. No intrahepatic or extrahepatic biliary ductal dilatation. Pancreas: Unremarkable Spleen: Unremarkable Adrenals/Urinary Tract: Normal adrenal glands. Kidneys enhance symmetrically with contrast. No hydronephrosis. Urinary bladder is decompressed. Stomach/Bowel: Normal appendix. Sigmoid colonic diverticulosis. Mild inflammatory stranding about the diverticuli (image 36; series 5) within the sigmoid colon. Normal morphology of the stomach. No evidence for small bowel obstruction. No free fluid or free intraperitoneal air. Vascular/Lymphatic: Normal caliber abdominal aorta. Retroaortic left renal vein. No retroperitoneal lymphadenopathy. Reproductive: Prostate unremarkable. Other: None. Musculoskeletal: No aggressive or acute appearing osseous lesions. IMPRESSION: Findings suggestive of mild acute sigmoid diverticulitis. No evidence for perforation or abscess formation. Electronically Signed   By: Annia Beltrew  Davis M.D.   On: 12/20/2017 10:29    Procedures Procedures (including critical care time)  Medications Ordered in ED Medications  ketorolac (TORADOL) 30 MG/ML injection 15 mg (15 mg Intravenous Given 12/20/17 0922)  haloperidol lactate (HALDOL) injection 2 mg (2 mg Intravenous Given 12/20/17 0935)    iopamidol (ISOVUE-300) 61 % injection 100 mL (100 mLs Intravenous Contrast Given 12/20/17 [id number])    With nurse, Windell Mouldinguth present EDP had a long pleasant discussion with patient regarding abnormal EKG and need for close follow up with Cardiology.  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  Cocaine can and does lead to accelerated coronary artery disease therefore it is imperative patient stop immediately.  He verbalizes understanding of all written and verbal instructions and agrees to follow up with cardiology.     Final Clinical Impressions(s) / ED Diagnoses    Return for weakness, numbness, changes in vision or speech, fevers >100.4 unrelieved by medication, shortness of breath, intractable vomiting, or diarrhea, abdominal pain, Inability to tolerate liquids or food, cough, altered mental  status or any concerns. No signs of systemic illness or infection. The patient is nontoxic-appearing on exam and vital signs are within normal limits.   I have reviewed the triage vital signs and the nursing notes. Pertinent labs &imaging results that were available during my care of the patient were reviewed by me and considered in my medical decision making (see chart for details).  After history, exam, and medical workup I feel the patient has been appropriately medically screened and is safe for discharge home. Pertinent diagnoses were discussed with the patient. Patient was given return precautions.     Teryl Mcconaghy, MD 12/20/17 1140    Bowie Delia, MD 12/20/17 1142

## 2017-12-20 NOTE — ED Notes (Signed)
ED Provider at bedside discussing test results and dispo plan of care. Discussing marijuana use and Cocaine use and its affect on his health.

## 2017-12-20 NOTE — ED Notes (Signed)
ED Provider at bedside. 

## 2017-12-20 NOTE — ED Notes (Signed)
Patient transported to CT 

## 2017-12-20 NOTE — ED Triage Notes (Signed)
Vomiting since 3am.  Pt sts he has been through this before and sts it is his Diverticulitis.

## 2018-04-30 ENCOUNTER — Emergency Department (HOSPITAL_COMMUNITY): Payer: Self-pay

## 2018-04-30 ENCOUNTER — Encounter (HOSPITAL_COMMUNITY): Payer: Self-pay | Admitting: Emergency Medicine

## 2018-04-30 ENCOUNTER — Emergency Department (HOSPITAL_COMMUNITY)
Admission: EM | Admit: 2018-04-30 | Discharge: 2018-04-30 | Disposition: A | Discharge status: Home / Self Care | Payer: Self-pay | Attending: Emergency Medicine | Admitting: Emergency Medicine

## 2018-04-30 DIAGNOSIS — Y929 Unspecified place or not applicable: Secondary | ICD-10-CM | POA: Insufficient documentation

## 2018-04-30 DIAGNOSIS — Y939 Activity, unspecified: Secondary | ICD-10-CM | POA: Insufficient documentation

## 2018-04-30 DIAGNOSIS — S0990XA Unspecified injury of head, initial encounter: Secondary | ICD-10-CM | POA: Insufficient documentation

## 2018-04-30 DIAGNOSIS — W3400XA Accidental discharge from unspecified firearms or gun, initial encounter: Secondary | ICD-10-CM

## 2018-04-30 DIAGNOSIS — S21431A Puncture wound without foreign body of right back wall of thorax with penetration into thoracic cavity, initial encounter: Secondary | ICD-10-CM | POA: Insufficient documentation

## 2018-04-30 DIAGNOSIS — S41141A Puncture wound with foreign body of right upper arm, initial encounter: Secondary | ICD-10-CM | POA: Insufficient documentation

## 2018-04-30 DIAGNOSIS — S0101XA Laceration without foreign body of scalp, initial encounter: Secondary | ICD-10-CM | POA: Insufficient documentation

## 2018-04-30 DIAGNOSIS — Y998 Other external cause status: Secondary | ICD-10-CM | POA: Insufficient documentation

## 2018-04-30 DIAGNOSIS — Z23 Encounter for immunization: Secondary | ICD-10-CM | POA: Insufficient documentation

## 2018-04-30 HISTORY — DX: Gastro-esophageal reflux disease without esophagitis: K21.9

## 2018-04-30 LAB — COMPREHENSIVE METABOLIC PANEL
ALK PHOS: 49 U/L (ref 38–126)
ALT: 27 U/L (ref 0–44)
ANION GAP: 27 — AB (ref 5–15)
AST: 42 U/L — ABNORMAL HIGH (ref 15–41)
Albumin: 3.9 g/dL (ref 3.5–5.0)
BUN: 16 mg/dL (ref 6–20)
CO2: 14 mmol/L — ABNORMAL LOW (ref 22–32)
Calcium: 9.1 mg/dL (ref 8.9–10.3)
Chloride: 97 mmol/L — ABNORMAL LOW (ref 98–111)
Creatinine, Ser: 1.67 mg/dL — ABNORMAL HIGH (ref 0.61–1.24)
GFR, EST AFRICAN AMERICAN: 59 mL/min — AB (ref >60)
GFR, EST NON AFRICAN AMERICAN: 51 mL/min — AB (ref >60)
Glucose, Bld: 180 mg/dL — ABNORMAL HIGH (ref 70–99)
Potassium: 3.7 mmol/L (ref 3.5–5.1)
Sodium: 138 mmol/L (ref 135–145)
Total Bilirubin: 1.1 mg/dL (ref 0.3–1.2)
Total Protein: 6.7 g/dL (ref 6.5–8.1)

## 2018-04-30 LAB — PREPARE FRESH FROZEN PLASMA

## 2018-04-30 LAB — ABO/RH: ABO/RH(D): A POS

## 2018-04-30 LAB — CBC
HCT: 48.8 % (ref 39.0–52.0)
HEMOGLOBIN: 16.2 g/dL (ref 13.0–17.0)
MCH: 31.9 pg (ref 26.0–34.0)
MCHC: 33.2 g/dL (ref 30.0–36.0)
MCV: 96.1 fL (ref 78.0–100.0)
Platelets: 269 10*3/uL (ref 150–400)
RBC: 5.08 MIL/uL (ref 4.22–5.81)
RDW: 13.4 % (ref 11.5–15.5)
WBC: 13.6 10*3/uL — AB (ref 4.0–10.5)

## 2018-04-30 LAB — BPAM FFP
Blood Product Expiration Date: 201908072359
Blood Product Expiration Date: 201908072359
ISSUE DATE / TIME: 201908030008
ISSUE DATE / TIME: 201908030008
UNIT TYPE AND RH: 6200
Unit Type and Rh: 6200

## 2018-04-30 LAB — PROTIME-INR
INR: 0.97
Prothrombin Time: 12.8 seconds (ref 11.4–15.2)

## 2018-04-30 LAB — TYPE AND SCREEN
ABO/RH(D): A POS
Antibody Screen: NEGATIVE
UNIT DIVISION: 0
UNIT DIVISION: 0

## 2018-04-30 LAB — I-STAT CHEM 8, ED
BUN: 19 mg/dL (ref 6–20)
CALCIUM ION: 1 mmol/L — AB (ref 1.15–1.40)
CHLORIDE: 103 mmol/L (ref 98–111)
CREATININE: 1.5 mg/dL — AB (ref 0.61–1.24)
GLUCOSE: 171 mg/dL — AB (ref 70–99)
HCT: 50 % (ref 39.0–52.0)
HEMOGLOBIN: 17 g/dL (ref 13.0–17.0)
Potassium: 3.4 mmol/L — ABNORMAL LOW (ref 3.5–5.1)
Sodium: 137 mmol/L (ref 135–145)
TCO2: 16 mmol/L — ABNORMAL LOW (ref 22–32)

## 2018-04-30 LAB — URINALYSIS, ROUTINE W REFLEX MICROSCOPIC
BILIRUBIN URINE: NEGATIVE
GLUCOSE, UA: 50 mg/dL — AB
Ketones, ur: NEGATIVE mg/dL
LEUKOCYTES UA: NEGATIVE
NITRITE: NEGATIVE
PH: 6 (ref 5.0–8.0)
Protein, ur: 30 mg/dL — AB
Specific Gravity, Urine: 1.034 — ABNORMAL HIGH (ref 1.005–1.030)

## 2018-04-30 LAB — CDS SEROLOGY

## 2018-04-30 LAB — BPAM RBC
BLOOD PRODUCT EXPIRATION DATE: 201908182359
BLOOD PRODUCT EXPIRATION DATE: 201908182359
ISSUE DATE / TIME: 201908030007
ISSUE DATE / TIME: 201908030007
Unit Type and Rh: 9500
Unit Type and Rh: 9500

## 2018-04-30 LAB — I-STAT CG4 LACTIC ACID, ED
Lactic Acid, Venous: 13.69 mmol/L (ref 0.5–1.9)
Lactic Acid, Venous: 2.94 mmol/L (ref 0.5–1.9)

## 2018-04-30 LAB — ETHANOL

## 2018-04-30 MED ORDER — LACTATED RINGERS IV BOLUS
1000.0000 mL | Freq: Once | INTRAVENOUS | Status: AC
  Administered 2018-04-30: 1000 mL via INTRAVENOUS

## 2018-04-30 MED ORDER — FENTANYL CITRATE (PF) 100 MCG/2ML IJ SOLN
100.0000 ug | Freq: Once | INTRAMUSCULAR | Status: AC
  Administered 2018-04-30: 100 ug via INTRAVENOUS
  Filled 2018-04-30: qty 2

## 2018-04-30 MED ORDER — FENTANYL CITRATE (PF) 100 MCG/2ML IJ SOLN
150.0000 ug | Freq: Once | INTRAMUSCULAR | Status: AC
  Administered 2018-04-30: 150 ug via INTRAVENOUS

## 2018-04-30 MED ORDER — SODIUM CHLORIDE 0.9 % IV BOLUS (SEPSIS)
1000.0000 mL | Freq: Once | INTRAVENOUS | Status: AC
  Administered 2018-04-30: 1000 mL via INTRAVENOUS

## 2018-04-30 MED ORDER — FENTANYL CITRATE (PF) 100 MCG/2ML IJ SOLN
INTRAMUSCULAR | Status: AC
  Filled 2018-04-30: qty 4

## 2018-04-30 MED ORDER — TETANUS-DIPHTH-ACELL PERTUSSIS 5-2.5-18.5 LF-MCG/0.5 IM SUSP
INTRAMUSCULAR | Status: AC
  Filled 2018-04-30: qty 0.5

## 2018-04-30 MED ORDER — HYDROCODONE-ACETAMINOPHEN 5-325 MG PO TABS: 1.0000 | ORAL_TABLET | Freq: Four times a day (QID) | ORAL | 0 refills | Status: DC | PRN

## 2018-04-30 MED ORDER — CEFAZOLIN SODIUM-DEXTROSE 2-4 GM/100ML-% IV SOLN
2.0000 g | Freq: Once | INTRAVENOUS | Status: AC
  Administered 2018-04-30: 2 g via INTRAVENOUS

## 2018-04-30 MED ORDER — CEFAZOLIN SODIUM-DEXTROSE 2-4 GM/100ML-% IV SOLN
INTRAVENOUS | Status: AC
  Filled 2018-04-30: qty 100

## 2018-04-30 MED ORDER — TETANUS-DIPHTH-ACELL PERTUSSIS 5-2.5-18.5 LF-MCG/0.5 IM SUSP
0.5000 mL | Freq: Once | INTRAMUSCULAR | Status: AC
  Administered 2018-04-30: 0.5 mL via INTRAMUSCULAR

## 2018-04-30 MED ORDER — IOHEXOL 300 MG/ML  SOLN
100.0000 mL | Freq: Once | INTRAMUSCULAR | Status: AC | PRN
  Administered 2018-04-30: 100 mL via INTRAVENOUS

## 2018-04-30 MED ORDER — CEPHALEXIN 500 MG PO CAPS: 500.0000 mg | ORAL_CAPSULE | Freq: Two times a day (BID) | ORAL | 0 refills | Status: DC

## 2018-04-30 MED ORDER — LIDOCAINE-EPINEPHRINE-TETRACAINE (LET) SOLUTION
3.0000 mL | Freq: Once | NASAL | Status: AC
  Administered 2018-04-30: 3 mL via TOPICAL
  Filled 2018-04-30: qty 3
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
## 2018-04-30 NOTE — ED Notes (Signed)
Given fentanyl IV.

## 2018-04-30 NOTE — ED Notes (Signed)
Pt transported to CT with Autumn, RCharity fundraiser
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
## 2018-04-30 NOTE — ED Notes (Signed)
Dr Bebe ShaggyWickline informed of lactic acid results 2.94

## 2018-04-30 NOTE — ED Triage Notes (Signed)
[employer] at bedside speaking with patient

## 2018-04-30 NOTE — ED Notes (Signed)
Dr Bebe ShaggyWickline informed of lactic acid results 13.69

## 2018-05-02 ENCOUNTER — Encounter (HOSPITAL_BASED_OUTPATIENT_CLINIC_OR_DEPARTMENT_OTHER): Payer: Self-pay | Admitting: Emergency Medicine

## 2018-05-09 ENCOUNTER — Encounter (HOSPITAL_COMMUNITY): Payer: Self-pay | Admitting: Emergency Medicine

## 2018-05-09 ENCOUNTER — Emergency Department (HOSPITAL_COMMUNITY)
Admission: EM | Admit: 2018-05-09 | Discharge: 2018-05-09 | Disposition: A | Discharge status: Home / Self Care | Payer: Self-pay | Attending: Emergency Medicine | Admitting: Emergency Medicine

## 2018-05-09 DIAGNOSIS — Z79899 Other long term (current) drug therapy: Secondary | ICD-10-CM | POA: Insufficient documentation

## 2018-05-09 DIAGNOSIS — Z4802 Encounter for removal of sutures: Secondary | ICD-10-CM | POA: Insufficient documentation

## 2018-05-09 DIAGNOSIS — F17200 Nicotine dependence, unspecified, uncomplicated: Secondary | ICD-10-CM | POA: Insufficient documentation

## 2018-05-09 NOTE — ED Triage Notes (Signed)
Patient here for staple removal after having them placed on his scalp 8/2. Wound is clean, no drainage, erythema. Patient alert, oriented, and ambulating independently with steady gait.

## 2018-05-09 NOTE — ED Provider Notes (Signed)
[facility]   CSN: [id number] Arrival date & time: 05/09/18  1018     History   Chief Complaint Chief Complaint  Patient presents with  . Suture / Staple Removal    HPI Spencer Garza is a 37 y.o. male.  HPI  Spencer Garza is a 37 y.o. male, presenting to the ED for staple removal from scalp lacerations repaired August 3.  Denies increased pain, fever, drainage, swelling, or redness.     Past Medical History:  Diagnosis Date  . Diverticulitis   . GERD (gastroesophageal reflux disease)     There are no active problems to display for this patient.   History reviewed. No pertinent surgical history.      Home Medications    Prior to Admission medications   Medication Sig Start Date End Date Taking? Authorizing Provider  amoxicillin-clavulanate (AUGMENTIN) 875-125 MG tablet Take 1 tablet by mouth 2 (two) times daily. One po bid x 10 days 12/20/17   Palumbo, April, MD  cephALEXin (KEFLEX) 500 MG capsule Take 1 capsule (500 mg total) by mouth 2 (two) times daily. 04/30/18   Zadie RhineWickline, Donald, MD  ciprofloxacin (CIPRO) 500 MG tablet Take 1 tablet (500 mg total) by mouth 2 (two) times daily. 03/12/17   Mackuen, Courteney Lyn, MD  Diclofenac Sodium CR (VOLTAREN-XR) 100 MG 24 hr tablet Take 1 tablet (100 mg total) by mouth daily. 12/20/17   Palumbo, April, MD  famotidine (PEPCID) 20 MG tablet Take 20 mg by mouth 2 (two) times daily.    [provider]  guaiFENesin (ROBITUSSIN) 100 MG/5ML liquid Take 5-10 mLs (100-200 mg total) by mouth every 4 (four) hours as needed for congestion. 08/16/16   Fayrene Helperran, Bowie, PA-C  HYDROcodone-acetaminophen (NORCO/VICODIN) 5-325 MG tablet Take 1 tablet by mouth every 6 (six) hours as needed for severe pain. 04/30/18   Zadie RhineWickline, Donald, MD  ibuprofen (ADVIL,MOTRIN) 800 MG tablet Take 1 tablet (800 mg total) by mouth every 8 (eight) hours as needed for mild pain. 03/16/16   Ward, Layla MawKristen N, DO    metroNIDAZOLE (FLAGYL) 500 MG tablet Take 1 tablet (500 mg total) by mouth 2 (two) times daily. 03/12/17   Mackuen, Courteney Lyn, MD  omeprazole (PRILOSEC) 20 MG capsule Take 1 capsule (20 mg total) by mouth daily. 03/12/17   Mackuen, Courteney Lyn, MD  promethazine-dextromethorphan (PROMETHAZINE-DM) 6.25-15 MG/5ML syrup Take 5 mLs by mouth 4 (four) times daily as needed for cough. 08/16/16   Fayrene Helperran, Bowie, PA-C    Family History No family history on file.  Social History Social History   Tobacco Use  . Smoking status: Current Every Day Smoker    Packs/day: 0.50  . Smokeless tobacco: Never Used  Substance Use Topics  . Alcohol use: Yes    Comment: drinks liquor daily  . Drug use: Yes    Types: Marijuana    Comment: last smoked yesterday     Allergies   Patient has no known allergies.   Review of Systems Review of Systems  Constitutional: Negative for fever.  Skin: Positive for wound.     Physical Exam Updated Vital Signs BP (!) 134/91 (BP Location: Right Arm)   Pulse 78   Temp 98.4 F (36.9 C) (Oral)   Resp 18   SpO2 98%   Physical Exam  Constitutional: He appears well-developed and well-nourished. No distress.  HENT:  Head: Normocephalic.  2 staples lacerations on the scalp. No tenderness, swelling, erythema, or drainage from the  wounds.  No evidence of dehiscence.  Wounds appear well-healed.  Eyes: Conjunctivae are normal.  Neck: Neck supple.  Cardiovascular: Normal rate and regular rhythm.  Pulmonary/Chest: Effort normal.  Neurological: He is alert.  Skin: Skin is warm and dry. He is not diaphoretic. No pallor.  Psychiatric: He has a normal mood and affect. His behavior is normal.  Nursing note and vitals reviewed.    ED Treatments / Results  Labs (all labs ordered are listed, but only abnormal results are displayed) Labs Reviewed - No data to display  EKG None  Radiology No results found.  Procedures .Suture Removal Date/Time: 05/09/2018  10:49 AM Performed by: Anselm PancoastJoy, Kianna Billet C, PA-C Authorized by: Anselm PancoastJoy, Kerith Sherley C, PA-C   Consent:    Consent obtained:  Verbal   Consent given by:  Patient   Risks discussed:  Bleeding, pain and wound separation Location:    Location:  Head/neck   Head/neck location:  Scalp Procedure details:    Wound appearance:  No signs of infection, good wound healing and clean   Number of staples removed:  1 Post-procedure details:    Post-removal:  No dressing applied   Patient tolerance of procedure:  Tolerated well, no immediate complications .Suture Removal Date/Time: 05/09/2018 10:49 AM Performed by: Anselm PancoastJoy, Diante Barley C, PA-C Authorized by: Anselm PancoastJoy, Moishe Schellenberg C, PA-C   Consent:    Consent obtained:  Verbal   Consent given by:  Patient   Risks discussed:  Bleeding, pain and wound separation Location:    Location:  Head/neck   Head/neck location:  Scalp Procedure details:    Wound appearance:  No signs of infection, good wound healing and clean   Number of staples removed:  3 Post-procedure details:    Post-removal:  No dressing applied   Patient tolerance of procedure:  Tolerated well, no immediate complications   (including critical care time)  Medications Ordered in ED Medications - No data to display   Initial Impression / Assessment and Plan / ED Course  I have reviewed the triage vital signs and the nursing notes.  Pertinent labs & imaging results that were available during my care of the patient were reviewed by me and considered in my medical decision making (see chart for details).     Patient presents for staple removal from scalp wounds.  Wounds appear well-healed without signs of infection or dehiscence.  Continued wound care discussed.  Final Clinical Impressions(s) / ED Diagnoses   Final diagnoses:  Encounter for removal of sutures    ED Discharge Orders    None       Concepcion LivingJoy, Korina Tretter C, PA-C 05/09/18 1659    Eber HongMiller, Brian, MD 05/12/18 1021

## 2018-05-09 NOTE — Discharge Instructions (Signed)
Your wound appears to be healing well. Continue to keep the area clean and dry.   May apply ointments or lotions such as Aquaphor to the area to reduce scarring. The key is to keep the skin well hydrated and supple. It is also important to stay well hydrated by drinking plenty of water. Keep your scar protected from the sun. Cover the scar with sunscreen that has an SPF (sun protection factor) of 30 or higher. Do not put sunscreen on your scar until it has healed. Gently massage the scar using a circular motion. This will help to minimize the appearance of the scar. Do this only after the incision has closed and all of the sutures have been removed. Remember that the scar may appear lighter or darker than your normal skin color. This difference in color should even out with time. If your scar does not fade or go away with time and you do not like how it looks, consider talking with a plastic surgeon or a dermatologist.  

## 2019-09-25 ENCOUNTER — Other Ambulatory Visit: Payer: Self-pay

## 2019-09-25 ENCOUNTER — Emergency Department (HOSPITAL_BASED_OUTPATIENT_CLINIC_OR_DEPARTMENT_OTHER)
Admission: EM | Admit: 2019-09-25 | Discharge: 2019-09-25 | Disposition: A | Discharge status: Home / Self Care | Payer: BC Managed Care – PPO | Attending: Emergency Medicine | Admitting: Emergency Medicine

## 2019-09-25 ENCOUNTER — Encounter (HOSPITAL_BASED_OUTPATIENT_CLINIC_OR_DEPARTMENT_OTHER): Payer: Self-pay | Admitting: *Deleted

## 2019-09-25 DIAGNOSIS — F17210 Nicotine dependence, cigarettes, uncomplicated: Secondary | ICD-10-CM | POA: Insufficient documentation

## 2019-09-25 DIAGNOSIS — Z79899 Other long term (current) drug therapy: Secondary | ICD-10-CM | POA: Diagnosis not present

## 2019-09-25 DIAGNOSIS — R21 Rash and other nonspecific skin eruption: Secondary | ICD-10-CM | POA: Insufficient documentation

## 2019-09-25 MED ORDER — DIPHENHYDRAMINE-ZINC ACETATE 2-0.1 % EX CREA: 1.0000 "application " | TOPICAL_CREAM | Freq: Three times a day (TID) | CUTANEOUS | 0 refills | Status: DC | PRN

## 2019-09-25 NOTE — ED Triage Notes (Signed)
Rash over his body. He thinks it could be bed bugs.

## 2019-09-25 NOTE — ED Provider Notes (Signed)
[facility] EMERGENCY DEPARTMENT Provider Note   CSN: [id number] Arrival date & time: 09/25/19  1916     History Chief Complaint  Patient presents with  . Rash    Spencer Garza is a 38 y.o. male.  HPI  38 year old male with history of diverticulitis, GERD, who presents emergency department today for evaluation of a rash.  States that he was at [facility] last week and when he left yesterday he noticed a rash to his bilateral arms.  States that he has multiple red raised areas that are itchy.  He is concerned that he may have contracted bedbugs from the house that he stayed in.  No one else in the house has similar symptoms however he states that the last day he was there he slept in a different area where no one else was.  He denies any other systemic symptoms including no fevers, shortness of breath, wheezing.  Past Medical History:  Diagnosis Date  . Diverticulitis   . GERD (gastroesophageal reflux disease)     There are no problems to display for this patient.   History reviewed. No pertinent surgical history.     No family history on file.  Social History   Tobacco Use  . Smoking status: Current Every Day Smoker    Packs/day: 0.50  . Smokeless tobacco: Never Used  Substance Use Topics  . Alcohol use: Yes    Comment: drinks liquor daily  . Drug use: Yes    Types: Marijuana    Comment: last smoked yesterday    Home Medications Prior to Admission medications   Medication Sig Start Date End Date Taking? Authorizing Provider  amoxicillin-clavulanate (AUGMENTIN) 875-125 MG tablet Take 1 tablet by mouth 2 (two) times daily. One po bid x 10 days 12/20/17   Palumbo, April, MD  cephALEXin (KEFLEX) 500 MG capsule Take 1 capsule (500 mg total) by mouth 2 (two) times daily. 04/30/18   Zadie Rhine, MD  ciprofloxacin (CIPRO) 500 MG tablet Take 1 tablet (500 mg total) by mouth 2 (two) times daily. 03/12/17   Mackuen, Courteney Lyn, MD  Diclofenac Sodium CR  (VOLTAREN-XR) 100 MG 24 hr tablet Take 1 tablet (100 mg total) by mouth daily. 12/20/17   Palumbo, April, MD  diphenhydrAMINE-zinc acetate (BENADRYL EXTRA STRENGTH) cream Apply 1 application topically 3 (three) times daily as needed for itching. 09/25/19   Cletus Paris S, PA-C  famotidine (PEPCID) 20 MG tablet Take 20 mg by mouth 2 (two) times daily.    [provider]  guaiFENesin (ROBITUSSIN) 100 MG/5ML liquid Take 5-10 mLs (100-200 mg total) by mouth every 4 (four) hours as needed for congestion. 08/16/16   Fayrene Helper, PA-C  HYDROcodone-acetaminophen (NORCO/VICODIN) 5-325 MG tablet Take 1 tablet by mouth every 6 (six) hours as needed for severe pain. 04/30/18   Zadie Rhine, MD  ibuprofen (ADVIL,MOTRIN) 800 MG tablet Take 1 tablet (800 mg total) by mouth every 8 (eight) hours as needed for mild pain. 03/16/16   Ward, Layla Maw, DO  metroNIDAZOLE (FLAGYL) 500 MG tablet Take 1 tablet (500 mg total) by mouth 2 (two) times daily. 03/12/17   Mackuen, Courteney Lyn, MD  omeprazole (PRILOSEC) 20 MG capsule Take 1 capsule (20 mg total) by mouth daily. 03/12/17   Mackuen, Courteney Lyn, MD  promethazine-dextromethorphan (PROMETHAZINE-DM) 6.25-15 MG/5ML syrup Take 5 mLs by mouth 4 (four) times daily as needed for cough. 08/16/16   Fayrene Helper, PA-C    Allergies    Patient has no  known allergies.  Review of Systems   Review of Systems  Constitutional: Negative for fever.  HENT: Negative for ear pain and sore throat.   Eyes: Negative for visual disturbance.  Respiratory: Negative for cough and shortness of breath.   Cardiovascular: Negative for chest pain.  Gastrointestinal: Negative for abdominal pain, constipation, diarrhea, nausea and vomiting.  Genitourinary: Negative for dysuria.  Musculoskeletal: Negative for back pain.  Skin: Positive for color change and rash.  Neurological: Negative for headaches.  All other systems reviewed and are negative.   Physical Exam Updated Vital  Signs BP (!) 123/97 (BP Location: Left Arm)   Pulse 98   Temp 99.2 F (37.3 C) (Oral)   Resp 18   Ht 5\' 7"  (1.702 m)   Wt 117.9 kg   SpO2 99%   BMI 40.72 kg/m   Physical Exam Constitutional:      General: He is not in acute distress.    Appearance: He is well-developed.  Eyes:     Conjunctiva/sclera: Conjunctivae normal.  Pulmonary:     Effort: Pulmonary effort is normal.  Skin:    General: Skin is warm and dry.     Comments: Multiple, well-circumscribed raised, erythematous areas of skin that are nontender and seem to be situated in a linear pattern.  These are noted to the bilateral arms.  There is one other area noted to the back and 1 to the lower abdomen as well.  No fluctuance noted.  Neurological:     Mental Status: He is alert and oriented to person, place, and time.     ED Results / Procedures / Treatments   Labs (all labs ordered are listed, but only abnormal results are displayed) Labs Reviewed - No data to display  EKG None  Radiology No results found.  Procedures Procedures (including critical care time)  Medications Ordered in ED Medications - No data to display  ED Course  I have reviewed the triage vital signs and the nursing notes.  Pertinent labs & imaging results that were available during my care of the patient were reviewed by me and considered in my medical decision making (see chart for details).    MDM Rules/Calculators/A&P                      Rash consistent with insect bites. History is also suggestive of this. Patient denies any difficulty breathing or swallowing.  Pt has a patent airway without stridor and is handling secretions without difficulty; no angioedema. No blisters, no pustules, no warmth, no draining sinus tracts, no superficial abscesses, no bullous impetigo, no vesicles, no desquamation, no target lesions with dusky purpura or a central bulla. Not tender to touch. No concern for superimposed infection. No concern for SJS,  TEN, TSS, tick borne illness, syphilis or other life-threatening condition. Will discharge home with benadryl cream recommend Benadryl/claritin and calamine lotion as needed for pruritis.     Final Clinical Impression(s) / ED Diagnoses Final diagnoses:  Rash    Rx / DC Orders ED Discharge Orders         Ordered    diphenhydrAMINE-zinc acetate (BENADRYL EXTRA STRENGTH) cream  3 times daily PRN     09/25/19 2047           Rodney Booze, PA-C 09/25/19 2047    Maudie Flakes, MD 09/26/19 2325

## 2019-09-25 NOTE — ED Notes (Signed)
Pt. Seeing PAC at present time

## 2019-09-25 NOTE — Discharge Instructions (Addendum)
Please use the Benadryl cream as directed.  You may also buy calamine lotion to help with the itching.  You may use Benadryl at night by mouth to help with itching and you may use Claritin during the day as it will not make you sleepy.  Do not drive after taking Benadryl as it can make you sleepy.  Please return to the emergency department for any new or worsening symptoms.

## 2020-01-16 ENCOUNTER — Emergency Department (HOSPITAL_BASED_OUTPATIENT_CLINIC_OR_DEPARTMENT_OTHER)
Admission: EM | Admit: 2020-01-16 | Discharge: 2020-01-16 | Disposition: A | Discharge status: Home / Self Care | Payer: BC Managed Care – PPO | Attending: Emergency Medicine | Admitting: Emergency Medicine

## 2020-01-16 ENCOUNTER — Encounter (HOSPITAL_BASED_OUTPATIENT_CLINIC_OR_DEPARTMENT_OTHER): Payer: Self-pay | Admitting: Emergency Medicine

## 2020-01-16 ENCOUNTER — Other Ambulatory Visit: Payer: Self-pay

## 2020-01-16 DIAGNOSIS — F17210 Nicotine dependence, cigarettes, uncomplicated: Secondary | ICD-10-CM | POA: Insufficient documentation

## 2020-01-16 DIAGNOSIS — K5732 Diverticulitis of large intestine without perforation or abscess without bleeding: Secondary | ICD-10-CM | POA: Diagnosis not present

## 2020-01-16 DIAGNOSIS — K5792 Diverticulitis of intestine, part unspecified, without perforation or abscess without bleeding: Secondary | ICD-10-CM

## 2020-01-16 DIAGNOSIS — R1032 Left lower quadrant pain: Secondary | ICD-10-CM | POA: Diagnosis present

## 2020-01-16 LAB — CBC
HCT: 45.2 % (ref 39.0–52.0)
Hemoglobin: 15.8 g/dL (ref 13.0–17.0)
MCH: 31.7 pg (ref 26.0–34.0)
MCHC: 35 g/dL (ref 30.0–36.0)
MCV: 90.8 fL (ref 80.0–100.0)
Platelets: 263 10*3/uL (ref 150–400)
RBC: 4.98 MIL/uL (ref 4.22–5.81)
RDW: 13.4 % (ref 11.5–15.5)
WBC: 8.3 10*3/uL (ref 4.0–10.5)
nRBC: 0 % (ref 0.0–0.2)

## 2020-01-16 LAB — COMPREHENSIVE METABOLIC PANEL
ALT: 32 U/L (ref 0–44)
AST: 27 U/L (ref 15–41)
Albumin: 3.9 g/dL (ref 3.5–5.0)
Alkaline Phosphatase: 46 U/L (ref 38–126)
Anion gap: 10 (ref 5–15)
BUN: 10 mg/dL (ref 6–20)
CO2: 24 mmol/L (ref 22–32)
Calcium: 9 mg/dL (ref 8.9–10.3)
Chloride: 101 mmol/L (ref 98–111)
Creatinine, Ser: 1.15 mg/dL (ref 0.61–1.24)
GFR calc Af Amer: >60 mL/min (ref >60)
GFR calc non Af Amer: >60 mL/min (ref >60)
Glucose, Bld: 115 mg/dL — ABNORMAL HIGH (ref 70–99)
Potassium: 3.4 mmol/L — ABNORMAL LOW (ref 3.5–5.1)
Sodium: 135 mmol/L (ref 135–145)
Total Bilirubin: 0.7 mg/dL (ref 0.3–1.2)
Total Protein: 7 g/dL (ref 6.5–8.1)

## 2020-01-16 LAB — URINALYSIS, ROUTINE W REFLEX MICROSCOPIC
Bilirubin Urine: NEGATIVE
Glucose, UA: NEGATIVE mg/dL
Hgb urine dipstick: NEGATIVE
Ketones, ur: NEGATIVE mg/dL
Leukocytes,Ua: NEGATIVE
Nitrite: NEGATIVE
Protein, ur: NEGATIVE mg/dL
Specific Gravity, Urine: 1.015 (ref 1.005–1.030)
pH: 6 (ref 5.0–8.0)

## 2020-01-16 LAB — LIPASE, BLOOD: Lipase: 28 U/L (ref 11–51)

## 2020-01-16 MED ORDER — CIPROFLOXACIN HCL 500 MG PO TABS
500.0000 mg | ORAL_TABLET | Freq: Once | ORAL | Status: AC
  Administered 2020-01-16: 500 mg via ORAL
  Filled 2020-01-16: qty 1

## 2020-01-16 MED ORDER — METRONIDAZOLE 500 MG PO TABS
500.0000 mg | ORAL_TABLET | Freq: Once | ORAL | Status: AC
  Administered 2020-01-16: 23:00:00 500 mg via ORAL
  Filled 2020-01-16: qty 1

## 2020-01-16 MED ORDER — ONDANSETRON 4 MG PO TBDP
4.0000 mg | ORAL_TABLET | Freq: Once | ORAL | Status: AC
  Administered 2020-01-16: 23:00:00 4 mg via ORAL
  Filled 2020-01-16: qty 1

## 2020-01-16 MED ORDER — METRONIDAZOLE 500 MG PO TABS: 500.0000 mg | ORAL_TABLET | Freq: Two times a day (BID) | ORAL | 0 refills | Status: DC

## 2020-01-16 MED ORDER — HYDROCODONE-ACETAMINOPHEN 5-325 MG PO TABS: 1.0000 | ORAL_TABLET | ORAL | 0 refills | Status: DC | PRN

## 2020-01-16 MED ORDER — CIPROFLOXACIN HCL 500 MG PO TABS: 500.0000 mg | ORAL_TABLET | Freq: Two times a day (BID) | ORAL | 0 refills | Status: DC

## 2020-01-16 MED ORDER — SODIUM CHLORIDE 0.9% FLUSH
3.0000 mL | Freq: Once | INTRAVENOUS | Status: DC
  Filled 2020-01-16: qty 3

## 2020-01-16 NOTE — ED Notes (Signed)
Water and crackers given

## 2020-01-16 NOTE — ED Provider Notes (Signed)
[facility] EMERGENCY DEPARTMENT Provider Note   CSN: [id number] Arrival date & time: 01/16/20  2023     History Chief Complaint  Patient presents with  . Flank Pain    Spencer Garza is a 39 y.o. male.  39yo M w/ PMH including diverticulitis and GERD who p/w abd pain and vomiting. 2 days ago, he began having constant pain on his left side wrapping around to left flank associated w/ N and vomiting. The pain feels like a pressure or bloating. He has had constipation, no diarrhea. He reports decreased appetite. Last emesis was tonight prior to arrival. He reports generalized weakness, no fevers or urinary symptoms. No sick contacts or cough/cold symptoms.  The history is provided by the patient.  Flank Pain       Past Medical History:  Diagnosis Date  . Diverticulitis   . GERD (gastroesophageal reflux disease)     There are no problems to display for this patient.   History reviewed. No pertinent surgical history.     Family History  Problem Relation Age of Onset  . Hypertension Other     Social History   Tobacco Use  . Smoking status: Current Every Day Smoker    Packs/day: 0.50    Types: Cigarettes  . Smokeless tobacco: Never Used  Substance Use Topics  . Alcohol use: Yes    Comment: drinks liquor daily  . Drug use: Yes    Types: Marijuana    Comment: last smoked yesterday    Home Medications Prior to Admission medications   Medication Sig Start Date End Date Taking? Authorizing Provider  amoxicillin-clavulanate (AUGMENTIN) 875-125 MG tablet Take 1 tablet by mouth 2 (two) times daily. One po bid x 10 days 12/20/17   Palumbo, April, MD  cephALEXin (KEFLEX) 500 MG capsule Take 1 capsule (500 mg total) by mouth 2 (two) times daily. 04/30/18   Zadie Rhine, MD  ciprofloxacin (CIPRO) 500 MG tablet Take 1 tablet (500 mg total) by mouth 2 (two) times daily. 03/12/17   Mackuen, Courteney Lyn, MD  Diclofenac Sodium CR (VOLTAREN-XR) 100 MG 24 hr tablet Take  1 tablet (100 mg total) by mouth daily. 12/20/17   Palumbo, April, MD  diphenhydrAMINE-zinc acetate (BENADRYL EXTRA STRENGTH) cream Apply 1 application topically 3 (three) times daily as needed for itching. 09/25/19   Couture, Cortni S, PA-C  famotidine (PEPCID) 20 MG tablet Take 20 mg by mouth 2 (two) times daily.    [provider]  guaiFENesin (ROBITUSSIN) 100 MG/5ML liquid Take 5-10 mLs (100-200 mg total) by mouth every 4 (four) hours as needed for congestion. 08/16/16   Fayrene Helper, PA-C  HYDROcodone-acetaminophen (NORCO/VICODIN) 5-325 MG tablet Take 1 tablet by mouth every 6 (six) hours as needed for severe pain. 04/30/18   Zadie Rhine, MD  ibuprofen (ADVIL,MOTRIN) 800 MG tablet Take 1 tablet (800 mg total) by mouth every 8 (eight) hours as needed for mild pain. 03/16/16   Ward, Layla Maw, DO  metroNIDAZOLE (FLAGYL) 500 MG tablet Take 1 tablet (500 mg total) by mouth 2 (two) times daily. 03/12/17   Mackuen, Courteney Lyn, MD  omeprazole (PRILOSEC) 20 MG capsule Take 1 capsule (20 mg total) by mouth daily. 03/12/17   Mackuen, Courteney Lyn, MD  promethazine-dextromethorphan (PROMETHAZINE-DM) 6.25-15 MG/5ML syrup Take 5 mLs by mouth 4 (four) times daily as needed for cough. 08/16/16   Fayrene Helper, PA-C    Allergies    Patient has no known allergies.  Review of Systems  Review of Systems  Genitourinary: Positive for flank pain.   All other systems reviewed and are negative except that which was mentioned in HPI  Physical Exam Updated Vital Signs BP (!) 138/102 (BP Location: Right Arm)   Pulse 100   Temp 98.7 F (37.1 C) (Oral)   Resp 18   Ht 5\' 6"  (1.676 m)   Wt 116.1 kg   SpO2 97%   BMI 41.32 kg/m   Physical Exam Vitals and nursing note reviewed.  Constitutional:      General: He is not in acute distress.    Appearance: He is well-developed.  HENT:     Head: Normocephalic and atraumatic.  Eyes:     Conjunctiva/sclera: Conjunctivae normal.  Cardiovascular:      Rate and Rhythm: Normal rate and regular rhythm.     Heart sounds: Normal heart sounds. No murmur.  Pulmonary:     Effort: Pulmonary effort is normal.     Breath sounds: Normal breath sounds.  Abdominal:     General: Bowel sounds are normal. There is no distension.     Palpations: Abdomen is soft.     Tenderness: There is no abdominal tenderness.  Musculoskeletal:     Cervical back: Neck supple.  Skin:    General: Skin is warm and dry.  Neurological:     Mental Status: He is alert and oriented to person, place, and time.     Comments: Fluent speech  Psychiatric:        Judgment: Judgment normal.     ED Results / Procedures / Treatments   Labs (all labs ordered are listed, but only abnormal results are displayed) Labs Reviewed  COMPREHENSIVE METABOLIC PANEL - Abnormal; Notable for the following components:      Result Value   Potassium 3.4 (*)    Glucose, Bld 115 (*)    All other components within normal limits  LIPASE, BLOOD  CBC  URINALYSIS, ROUTINE W REFLEX MICROSCOPIC    EKG None  Radiology No results found.  Procedures Procedures (including critical care time)  Medications Ordered in ED Medications  sodium chloride flush (NS) 0.9 % injection 3 mL (has no administration in time range)  ondansetron (ZOFRAN-ODT) disintegrating tablet 4 mg (4 mg Oral Given 01/16/20 2237)  ciprofloxacin (CIPRO) tablet 500 mg (500 mg Oral Given 01/16/20 2252)  metroNIDAZOLE (FLAGYL) tablet 500 mg (500 mg Oral Given 01/16/20 2252)    ED Course  I have reviewed the triage vital signs and the nursing notes.  Pertinent labs & imaging results that were available during my care of the patient were reviewed by me and considered in my medical decision making (see chart for details).    MDM Rules/Calculators/A&P                      Nontoxic on exam, afebrile.  He did not actually have focal abdominal tenderness on exam which is reassuring.  He states that symptoms feel like previous  episodes of diverticulitis.  Lab work shows normal UA, reassuring CMP with normal LFTs and lipase, normal CBC.  I reviewed his chart which shows multiple previous ED visits with diagnosis of diverticulitis and several previous CT scans documenting sigmoid diverticulitis without complication.  Because of his young age and reassuring blood work here including normal WBC count, I feel it is reasonable to treat empirically and hold off on CT imaging to avoid radiation exposure.  Patient is in agreement with this plan.  Gave Zofran  followed by Cipro and Flagyl in the ED.  Patient with no episodes of vomiting.  Because of his recurrent diverticulitis and also problems with persistent GERD despite taking PPI, recommended evaluation by gastroenterology clinic.  Provided with contact information.  I have discussed supportive measures for symptoms and reviewed return precautions including worsening pain, fever, or bloody stools.  He voiced understanding. Final Clinical Impression(s) / ED Diagnoses Final diagnoses:  None    Rx / DC Orders ED Discharge Orders    None       Jaimy Kliethermes, Ambrose Finland, MD 01/16/20 2308

## 2020-01-16 NOTE — ED Triage Notes (Signed)
Pt is c/o left flank pain that started a couple of days ago  Pt has hx of diverticulitis  Pt states he has had N/V/D since Sunday evening  Pt states he has had some bright red blood in his stools

## 2020-04-26 ENCOUNTER — Other Ambulatory Visit: Payer: Self-pay

## 2020-04-26 ENCOUNTER — Emergency Department (HOSPITAL_BASED_OUTPATIENT_CLINIC_OR_DEPARTMENT_OTHER)
Admission: EM | Admit: 2020-04-26 | Discharge: 2020-04-26 | Disposition: A | Discharge status: Home / Self Care | Payer: No Typology Code available for payment source | Attending: Emergency Medicine | Admitting: Emergency Medicine

## 2020-04-26 DIAGNOSIS — F17210 Nicotine dependence, cigarettes, uncomplicated: Secondary | ICD-10-CM | POA: Insufficient documentation

## 2020-04-26 DIAGNOSIS — T63461A Toxic effect of venom of wasps, accidental (unintentional), initial encounter: Secondary | ICD-10-CM | POA: Insufficient documentation

## 2020-04-26 DIAGNOSIS — T7840XA Allergy, unspecified, initial encounter: Secondary | ICD-10-CM | POA: Insufficient documentation

## 2020-04-26 DIAGNOSIS — F1590 Other stimulant use, unspecified, uncomplicated: Secondary | ICD-10-CM | POA: Diagnosis not present

## 2020-04-26 MED ORDER — NAPROXEN 250 MG PO TABS
500.0000 mg | ORAL_TABLET | Freq: Once | ORAL | Status: AC
  Administered 2020-04-26: 07:00:00 500 mg via ORAL
  Filled 2020-04-26: qty 2

## 2020-04-26 NOTE — ED Triage Notes (Signed)
Wasp/hornet sting to left neck just prior to arrival. Airway clear. Ambulatory from work

## 2020-04-26 NOTE — ED Provider Notes (Signed)
[facility] Provider Note: Spencer Dell, MD, FACEP  CSN: [id number] MRN: [id number] ARRIVAL: 04/26/20 at 0534 ROOM: [facility]   CHIEF COMPLAINT  Insect Sting   HISTORY OF PRESENT ILLNESS  04/26/20 6:34 AM Spencer Garza is a 39 y.o. male who was stung by a wasp or hornet to the left neck just prior to arrival.  This occurred at work.  He came to the ED out of concern he might have an allergic reaction.  He has no history of allergic reactions to hymenoptera envenomation.  He has some localized pain and swelling at the site of the sting with pain radiating down his left arm.  He has no shortness of breath, itching, rash, throat swelling, nausea, vomiting or diarrhea.   Past Medical History:  Diagnosis Date  . Diverticulitis   . GERD (gastroesophageal reflux disease)     No past surgical history on file.  Family History  Problem Relation Age of Onset  . Hypertension Other     Social History   Tobacco Use  . Smoking status: Current Every Day Smoker    Packs/day: 0.50    Types: Cigarettes  . Smokeless tobacco: Never Used  Vaping Use  . Vaping Use: Never used  Substance Use Topics  . Alcohol use: Yes    Comment: drinks liquor daily  . Drug use: Yes    Types: Marijuana    Comment: last smoked yesterday    Prior to Admission medications   Not on File    Allergies Patient has no known allergies.   REVIEW OF SYSTEMS  Negative except as noted here or in the History of Present Illness.   PHYSICAL EXAMINATION  Initial Vital Signs Blood pressure (!) 143/108, pulse 104, temperature 98.6 F (37 C), temperature source Oral, resp. rate 18, SpO2 96 %.  Examination General: Well-developed, well-nourished male in no acute distress; appearance consistent with age of record HENT: normocephalic; atraumatic Eyes: pupils equal, round and reactive to light; extraocular muscles intact Neck: supple Heart: regular rate and rhythm Lungs: clear to auscultation  bilaterally Abdomen: soft; nondistended; nontender;  bowel sounds present Extremities: No deformity; full range of motion; pulses normal Neurologic: Awake, alert and oriented; motor function intact in all extremities and symmetric; no facial droop Skin: Warm and dry; small, tender slightly swollen lesion at site of reported sting Psychiatric: Normal mood and affect   RESULTS  Summary of this visit's results, reviewed and interpreted by myself:   EKG Interpretation  Date/Time:    Ventricular Rate:    PR Interval:    QRS Duration:   QT Interval:    QTC Calculation:   R Axis:     Text Interpretation:        Laboratory Studies: No results found for this or any previous visit (from the past 24 hour(s)). Imaging Studies: No results found.  ED COURSE and MDM  Nursing notes, initial and subsequent vitals signs, including pulse oximetry, reviewed and interpreted by myself.  Vitals:   04/26/20 0540 04/26/20 0640  BP: (!) 143/108   Pulse: 104   Resp: 18   Temp: 98.6 F (37 C)   TempSrc: Oral   SpO2: 96%   Weight:  (!) 116.1 kg  Height:  5\' 6"  (1.676 m)   Medications  naproxen (NAPROSYN) tablet 500 mg (has no administration in time range)    Patient has been observed in the ED over an hour without signs of an allergic reaction.  His vital signs are normal.  PROCEDURES  Procedures   ED DIAGNOSES     ICD-10-CM   1. Wasp sting, accidental or unintentional, initial encounter  T63.461A        Paula Libra, MD 04/26/20 [phone]

## 2020-11-18 ENCOUNTER — Emergency Department (HOSPITAL_BASED_OUTPATIENT_CLINIC_OR_DEPARTMENT_OTHER)
Admission: EM | Admit: 2020-11-18 | Discharge: 2020-11-18 | Disposition: A | Discharge status: Home / Self Care | Payer: BC Managed Care – PPO | Attending: Emergency Medicine | Admitting: Emergency Medicine

## 2020-11-18 ENCOUNTER — Encounter (HOSPITAL_BASED_OUTPATIENT_CLINIC_OR_DEPARTMENT_OTHER): Payer: Self-pay | Admitting: Emergency Medicine

## 2020-11-18 ENCOUNTER — Other Ambulatory Visit: Payer: Self-pay

## 2020-11-18 DIAGNOSIS — S161XXA Strain of muscle, fascia and tendon at neck level, initial encounter: Secondary | ICD-10-CM | POA: Insufficient documentation

## 2020-11-18 DIAGNOSIS — M25511 Pain in right shoulder: Secondary | ICD-10-CM | POA: Diagnosis not present

## 2020-11-18 DIAGNOSIS — S39012A Strain of muscle, fascia and tendon of lower back, initial encounter: Secondary | ICD-10-CM | POA: Diagnosis not present

## 2020-11-18 DIAGNOSIS — F17210 Nicotine dependence, cigarettes, uncomplicated: Secondary | ICD-10-CM | POA: Diagnosis not present

## 2020-11-18 DIAGNOSIS — S34109A Unspecified injury to unspecified level of lumbar spinal cord, initial encounter: Secondary | ICD-10-CM | POA: Diagnosis present

## 2020-11-18 DIAGNOSIS — Y92410 Unspecified street and highway as the place of occurrence of the external cause: Secondary | ICD-10-CM | POA: Insufficient documentation

## 2020-11-18 MED ORDER — METHOCARBAMOL 500 MG PO TABS: 500.0000 mg | ORAL_TABLET | Freq: Two times a day (BID) | ORAL | 0 refills | Status: AC
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
## 2020-11-18 NOTE — Discharge Instructions (Signed)
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
## 2021-02-11 ENCOUNTER — Other Ambulatory Visit: Payer: Self-pay

## 2021-02-11 ENCOUNTER — Emergency Department (HOSPITAL_BASED_OUTPATIENT_CLINIC_OR_DEPARTMENT_OTHER): Payer: BC Managed Care – PPO

## 2021-02-11 ENCOUNTER — Encounter (HOSPITAL_BASED_OUTPATIENT_CLINIC_OR_DEPARTMENT_OTHER): Payer: Self-pay | Admitting: *Deleted

## 2021-02-11 ENCOUNTER — Emergency Department (HOSPITAL_BASED_OUTPATIENT_CLINIC_OR_DEPARTMENT_OTHER)
Admission: EM | Admit: 2021-02-11 | Discharge: 2021-02-11 | Disposition: A | Discharge status: Home / Self Care | Payer: BC Managed Care – PPO | Attending: Emergency Medicine | Admitting: Emergency Medicine

## 2021-02-11 DIAGNOSIS — R079 Chest pain, unspecified: Secondary | ICD-10-CM | POA: Insufficient documentation

## 2021-02-11 DIAGNOSIS — R202 Paresthesia of skin: Secondary | ICD-10-CM | POA: Diagnosis not present

## 2021-02-11 DIAGNOSIS — F17210 Nicotine dependence, cigarettes, uncomplicated: Secondary | ICD-10-CM | POA: Diagnosis not present

## 2021-02-11 LAB — BASIC METABOLIC PANEL
Anion gap: 10 (ref 5–15)
BUN: 15 mg/dL (ref 6–20)
CO2: 23 mmol/L (ref 22–32)
Calcium: 9 mg/dL (ref 8.9–10.3)
Chloride: 101 mmol/L (ref 98–111)
Creatinine, Ser: 1.04 mg/dL (ref 0.61–1.24)
GFR, Estimated: >60 mL/min (ref >60)
Glucose, Bld: 99 mg/dL (ref 70–99)
Potassium: 3.9 mmol/L (ref 3.5–5.1)
Sodium: 134 mmol/L — ABNORMAL LOW (ref 135–145)

## 2021-02-11 LAB — CBC
HCT: 45.4 % (ref 39.0–52.0)
Hemoglobin: 16.3 g/dL (ref 13.0–17.0)
MCH: 32.5 pg (ref 26.0–34.0)
MCHC: 35.9 g/dL (ref 30.0–36.0)
MCV: 90.6 fL (ref 80.0–100.0)
Platelets: 249 10*3/uL (ref 150–400)
RBC: 5.01 MIL/uL (ref 4.22–5.81)
RDW: 13.1 % (ref 11.5–15.5)
WBC: 7.4 10*3/uL (ref 4.0–10.5)
nRBC: 0 % (ref 0.0–0.2)

## 2021-02-11 LAB — TROPONIN I (HIGH SENSITIVITY)
Troponin I (High Sensitivity): 4 ng/L (ref <18)
Troponin I (High Sensitivity): 2 ng/L (ref <18)

## 2021-02-11 MED ORDER — FAMOTIDINE 20 MG PO TABS
20.0000 mg | ORAL_TABLET | Freq: Once | ORAL | Status: AC
  Administered 2021-02-11: 20 mg via ORAL
  Filled 2021-02-11: qty 1

## 2021-02-11 MED ORDER — KETOROLAC TROMETHAMINE 30 MG/ML IJ SOLN
30.0000 mg | Freq: Once | INTRAMUSCULAR | Status: AC
  Administered 2021-02-11: 30 mg via INTRAVENOUS
  Filled 2021-02-11: qty 1

## 2021-02-11 MED ORDER — LIDOCAINE VISCOUS HCL 2 % MT SOLN
15.0000 mL | Freq: Once | OROMUCOSAL | Status: AC
  Administered 2021-02-11: 15 mL via ORAL
  Filled 2021-02-11: qty 15

## 2021-02-11 MED ORDER — ALUM & MAG HYDROXIDE-SIMETH 200-200-20 MG/5ML PO SUSP
30.0000 mL | Freq: Once | ORAL | Status: AC
  Administered 2021-02-11: 30 mL via ORAL
  Filled 2021-02-11: qty 30

## 2021-02-11 MED ORDER — KETOROLAC TROMETHAMINE 30 MG/ML IJ SOLN: 30.0000 mg | Freq: Once | INTRAMUSCULAR | Status: DC

## 2021-02-11 NOTE — ED Notes (Signed)
Pt to Xray.
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
## 2021-02-11 NOTE — Discharge Instructions (Signed)
AlsoYou were seen in the emergency department today for chest pain. Your work-up in the emergency department has been overall reassuring. Your labs have been fairly normal and or similar to previous blood work you have had done. Your EKG and the enzyme we use to check your heart did not show an acute heart attack at this time. Your chest x-ray was normal.   Reflux may be contributing to symptoms, you can increase your daily omeprazole dose to 40 mg (2 tablets) once daily in the morning before any food.  Use ibuprofen and Tylenol for potential musculoskeletal pain.  We would like you to follow up closely with your primary care provider and/or the cardiologist provided in your discharge instructions within 1 week. Return to the ER immediately should you experience any new or worsening symptoms including but not limited to return of pain, worsened pain, vomiting, shortness of breath, dizziness, lightheadedness, passing out, or any other concerns that you may have.

## 2021-02-11 NOTE — ED Triage Notes (Signed)
Left sided chest pressure all day. Tingling in both hands for the past hour. No SOB.

## 2021-02-11 NOTE — ED Notes (Signed)
Patient transported to X-ray 

## 2021-06-12 ENCOUNTER — Emergency Department (HOSPITAL_BASED_OUTPATIENT_CLINIC_OR_DEPARTMENT_OTHER)
Admission: EM | Admit: 2021-06-12 | Discharge: 2021-06-12 | Disposition: A | Discharge status: Home / Self Care | Payer: BC Managed Care – PPO | Attending: Emergency Medicine | Admitting: Emergency Medicine

## 2021-06-12 ENCOUNTER — Emergency Department (HOSPITAL_BASED_OUTPATIENT_CLINIC_OR_DEPARTMENT_OTHER): Payer: BC Managed Care – PPO

## 2021-06-12 ENCOUNTER — Encounter (HOSPITAL_BASED_OUTPATIENT_CLINIC_OR_DEPARTMENT_OTHER): Payer: Self-pay | Admitting: Emergency Medicine

## 2021-06-12 ENCOUNTER — Other Ambulatory Visit: Payer: Self-pay

## 2021-06-12 DIAGNOSIS — Z5321 Procedure and treatment not carried out due to patient leaving prior to being seen by health care provider: Secondary | ICD-10-CM | POA: Insufficient documentation

## 2021-06-12 DIAGNOSIS — R0602 Shortness of breath: Secondary | ICD-10-CM | POA: Insufficient documentation

## 2021-06-12 DIAGNOSIS — R0989 Other specified symptoms and signs involving the circulatory and respiratory systems: Secondary | ICD-10-CM | POA: Diagnosis not present

## 2021-06-12 MED ORDER — ALBUTEROL SULFATE HFA 108 (90 BASE) MCG/ACT IN AERS: 2.0000 | INHALATION_SPRAY | RESPIRATORY_TRACT | Status: DC | PRN
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
## 2023-08-11 ENCOUNTER — Emergency Department (HOSPITAL_BASED_OUTPATIENT_CLINIC_OR_DEPARTMENT_OTHER)
Admission: EM | Admit: 2023-08-11 | Discharge: 2023-08-11 | Disposition: A | Discharge status: Home / Self Care | Payer: Self-pay | Attending: Emergency Medicine | Admitting: Emergency Medicine

## 2023-08-11 ENCOUNTER — Other Ambulatory Visit: Payer: Self-pay

## 2023-08-11 ENCOUNTER — Emergency Department (HOSPITAL_BASED_OUTPATIENT_CLINIC_OR_DEPARTMENT_OTHER): Payer: Self-pay

## 2023-08-11 ENCOUNTER — Other Ambulatory Visit (HOSPITAL_BASED_OUTPATIENT_CLINIC_OR_DEPARTMENT_OTHER): Payer: Self-pay

## 2023-08-11 ENCOUNTER — Encounter (HOSPITAL_BASED_OUTPATIENT_CLINIC_OR_DEPARTMENT_OTHER): Payer: Self-pay | Admitting: Emergency Medicine

## 2023-08-11 DIAGNOSIS — I10 Essential (primary) hypertension: Secondary | ICD-10-CM | POA: Insufficient documentation

## 2023-08-11 DIAGNOSIS — Z79899 Other long term (current) drug therapy: Secondary | ICD-10-CM | POA: Insufficient documentation

## 2023-08-11 DIAGNOSIS — Z1152 Encounter for screening for COVID-19: Secondary | ICD-10-CM | POA: Insufficient documentation

## 2023-08-11 DIAGNOSIS — J069 Acute upper respiratory infection, unspecified: Secondary | ICD-10-CM | POA: Insufficient documentation

## 2023-08-11 HISTORY — DX: Essential (primary) hypertension: I10

## 2023-08-11 LAB — RESP PANEL BY RT-PCR (RSV, FLU A&B, COVID)  RVPGX2
Influenza A by PCR: NEGATIVE
Influenza B by PCR: NEGATIVE
Resp Syncytial Virus by PCR: NEGATIVE
SARS Coronavirus 2 by RT PCR: NEGATIVE

## 2023-08-11 LAB — SARS CORONAVIRUS 2 BY RT PCR: SARS Coronavirus 2 by RT PCR: NEGATIVE

## 2023-08-11 MED ORDER — BENZONATATE 100 MG PO CAPS
200.0000 mg | ORAL_CAPSULE | Freq: Once | ORAL | Status: AC
  Administered 2023-08-11: 200 mg via ORAL
  Filled 2023-08-11: qty 2

## 2023-08-11 MED ORDER — BENZONATATE 100 MG PO CAPS
100.0000 mg | ORAL_CAPSULE | Freq: Three times a day (TID) | ORAL | 0 refills | Status: AC
  Filled 2023-08-11: qty 21, 7d supply, fill #0

## 2023-08-11 NOTE — ED Notes (Signed)
pO 95  at lowest RA and heart rate 96  highest with walking
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
## 2023-08-11 NOTE — Discharge Instructions (Addendum)
It was a pleasure taking care of you today.  As discussed, your COVID and flu test were negative.  Chest x-ray does not show evidence of pneumonia.  I suspect you have a viral infection causing your symptoms.  I am sending you home with cough medication.  Take as needed.  You may take over-the-counter ibuprofen or Tylenol as needed for pain.  Please follow-up with PCP if symptoms do not improve over the next few days.  Return to the ER for any worsening symptoms.

## 2023-08-11 NOTE — ED Triage Notes (Signed)
States has had congestion x 5 days his daughter just over pneum 1 week   got wet at work and feels like that is when he got sick has taken OTC meds not helps
